# Patient Record
Sex: Female | Born: 1970 | Race: Black or African American | Hispanic: No | Marital: Single | Smoking: Current every day smoker
Health system: Southern US, Community
[De-identification: ages and names within clinical notes are randomized; demographics above are authoritative.]

## PROBLEM LIST (undated history)

## (undated) DIAGNOSIS — J45909 Unspecified asthma, uncomplicated: Secondary | ICD-10-CM

## (undated) DIAGNOSIS — M549 Dorsalgia, unspecified: Secondary | ICD-10-CM

## (undated) DIAGNOSIS — G8929 Other chronic pain: Secondary | ICD-10-CM

---

## 1998-01-11 ENCOUNTER — Inpatient Hospital Stay (HOSPITAL_COMMUNITY): Admission: AD | Admit: 1998-01-11 | Discharge: 1998-01-11 | Payer: Self-pay | Admitting: *Deleted

## 1998-09-04 ENCOUNTER — Emergency Department (HOSPITAL_COMMUNITY): Admission: EM | Admit: 1998-09-04 | Discharge: 1998-09-04 | Payer: Self-pay | Admitting: Emergency Medicine

## 1998-09-21 ENCOUNTER — Emergency Department (HOSPITAL_COMMUNITY): Admission: EM | Admit: 1998-09-21 | Discharge: 1998-09-21 | Payer: Self-pay | Admitting: Emergency Medicine

## 1999-04-23 ENCOUNTER — Emergency Department (HOSPITAL_COMMUNITY): Admission: EM | Admit: 1999-04-23 | Discharge: 1999-04-23 | Payer: Self-pay | Admitting: Emergency Medicine

## 1999-04-23 ENCOUNTER — Encounter: Payer: Self-pay | Admitting: Emergency Medicine

## 1999-04-27 ENCOUNTER — Ambulatory Visit (HOSPITAL_COMMUNITY): Admission: RE | Admit: 1999-04-27 | Discharge: 1999-04-28 | Payer: Self-pay | Admitting: General Surgery

## 1999-04-27 ENCOUNTER — Encounter (HOSPITAL_BASED_OUTPATIENT_CLINIC_OR_DEPARTMENT_OTHER): Payer: Self-pay | Admitting: General Surgery

## 1999-07-15 ENCOUNTER — Ambulatory Visit (HOSPITAL_COMMUNITY): Admission: RE | Admit: 1999-07-15 | Discharge: 1999-07-15 | Payer: Self-pay | Admitting: General Surgery

## 1999-08-19 ENCOUNTER — Emergency Department (HOSPITAL_COMMUNITY): Admission: EM | Admit: 1999-08-19 | Discharge: 1999-08-19 | Payer: Self-pay | Admitting: Emergency Medicine

## 1999-09-24 ENCOUNTER — Emergency Department (HOSPITAL_COMMUNITY): Admission: EM | Admit: 1999-09-24 | Discharge: 1999-09-24 | Payer: Self-pay | Admitting: Emergency Medicine

## 2004-04-16 ENCOUNTER — Emergency Department (HOSPITAL_COMMUNITY): Admission: EM | Admit: 2004-04-16 | Discharge: 2004-04-16 | Payer: Self-pay | Admitting: Emergency Medicine

## 2004-07-03 ENCOUNTER — Emergency Department (HOSPITAL_COMMUNITY): Admission: EM | Admit: 2004-07-03 | Discharge: 2004-07-03 | Payer: Self-pay | Admitting: Emergency Medicine

## 2004-10-07 ENCOUNTER — Emergency Department (HOSPITAL_COMMUNITY): Admission: EM | Admit: 2004-10-07 | Discharge: 2004-10-07 | Payer: Self-pay | Admitting: Emergency Medicine

## 2004-10-10 ENCOUNTER — Emergency Department (HOSPITAL_COMMUNITY): Admission: EM | Admit: 2004-10-10 | Discharge: 2004-10-10 | Payer: Self-pay | Admitting: Emergency Medicine

## 2005-07-04 ENCOUNTER — Emergency Department (HOSPITAL_COMMUNITY): Admission: EM | Admit: 2005-07-04 | Discharge: 2005-07-04 | Payer: Self-pay | Admitting: Emergency Medicine

## 2011-01-30 ENCOUNTER — Emergency Department (HOSPITAL_COMMUNITY): Payer: Self-pay

## 2011-01-30 ENCOUNTER — Emergency Department (HOSPITAL_COMMUNITY)
Admission: EM | Admit: 2011-01-30 | Discharge: 2011-01-30 | Disposition: A | Discharge status: Home / Self Care | Payer: Self-pay | Attending: Emergency Medicine | Admitting: Emergency Medicine

## 2011-01-30 DIAGNOSIS — S335XXA Sprain of ligaments of lumbar spine, initial encounter: Secondary | ICD-10-CM | POA: Insufficient documentation

## 2011-01-30 DIAGNOSIS — W108XXA Fall (on) (from) other stairs and steps, initial encounter: Secondary | ICD-10-CM | POA: Insufficient documentation

## 2011-01-30 DIAGNOSIS — M549 Dorsalgia, unspecified: Secondary | ICD-10-CM | POA: Insufficient documentation

## 2011-07-03 ENCOUNTER — Emergency Department (HOSPITAL_COMMUNITY)
Admission: EM | Admit: 2011-07-03 | Discharge: 2011-07-03 | Disposition: A | Discharge status: Home / Self Care | Payer: PRIVATE HEALTH INSURANCE | Attending: Emergency Medicine | Admitting: Emergency Medicine

## 2011-07-03 ENCOUNTER — Encounter: Payer: Self-pay | Admitting: *Deleted

## 2011-07-03 DIAGNOSIS — M549 Dorsalgia, unspecified: Secondary | ICD-10-CM | POA: Insufficient documentation

## 2011-07-03 DIAGNOSIS — M545 Low back pain, unspecified: Secondary | ICD-10-CM | POA: Insufficient documentation

## 2011-07-03 HISTORY — DX: Dorsalgia, unspecified: M54.9

## 2011-07-03 HISTORY — DX: Other chronic pain: G89.29

## 2011-07-03 MED ORDER — DIAZEPAM 5 MG PO TABS: 5.0000 mg | ORAL_TABLET | Freq: Four times a day (QID) | ORAL | Status: AC | PRN

## 2011-07-03 MED ORDER — IBUPROFEN 800 MG PO TABS: 800.0000 mg | ORAL_TABLET | Freq: Three times a day (TID) | ORAL | Status: AC | PRN

## 2011-07-03 MED ORDER — HYDROCODONE-ACETAMINOPHEN 5-325 MG PO TABS: ORAL_TABLET | ORAL | Status: AC

## 2011-07-03 NOTE — ED Notes (Signed)
Pt reports chronic back pain r/t ddd; noticed worsening back pain since Friday; denies injury;

## 2011-07-03 NOTE — ED Provider Notes (Signed)
Medical screening examination/treatment/procedure(s) were performed by non-physician practitioner and as supervising physician I was immediately available for consultation/collaboration.   Dayton Bailiff, MD 07/03/11 1248

## 2011-07-03 NOTE — ED Provider Notes (Signed)
History     CSN: [id number] Arrival date & time: 07/03/2011 11:03 AM   First MD Initiated Contact with Patient 07/03/11 1150      Chief Complaint  Patient presents with  . Back Pain    (Consider location/radiation/quality/duration/timing/severity/associated sxs/prior treatment) HPI Comments: Patient states that she suffers from chronic back pain but it's been worse since Friday. She doesn't recall any trauma or injury, however her pain has become unbearable. She rates it at an 8/10. She denies any fever, abdominal pain, urinary retention, loss of bowel or bladder control, pain radiating down her legs, numbness or weakness of her lower extremities, dizziness and lightheadedness. Patient currently has no other complaints.  Patient is a 40 y.o. female presenting with back pain. The history is provided by the patient.  Back Pain  Pertinent negatives include no chest pain, no fever, no numbness, no headaches, no abdominal pain and no weakness.    Past Medical History  Diagnosis Date  . Chronic back pain     History reviewed. No pertinent past surgical history.  No family history on file.  History  Substance Use Topics  . Smoking status: Current Everyday Smoker  . Smokeless tobacco: Not on file  . Alcohol Use: No    OB History    Grav Para Term Preterm Abortions TAB SAB Ect Mult Living                  Review of Systems  Constitutional: Positive for activity change. Negative for fever, chills, fatigue and unexpected weight change.  HENT: Negative for neck pain and neck stiffness.   Eyes: Negative for visual disturbance.  Respiratory: Negative for shortness of breath.   Cardiovascular: Negative for chest pain and leg swelling.  Gastrointestinal: Negative for nausea, abdominal pain, constipation and rectal pain.  Genitourinary: Negative for urgency and difficulty urinating.       Patient denies bowel and bladder incontinence.  Musculoskeletal: Positive for back pain.  Negative for myalgias, joint swelling, arthralgias and gait problem.  Neurological: Negative for weakness, numbness and headaches.  All other systems reviewed and are negative.    Allergies  Review of patient's allergies indicates no known allergies.  Home Medications  No current outpatient prescriptions on file.  BP 114/61  Pulse 73  Temp(Src) 98.6 F (37 C) (Oral)  Resp 18  SpO2 100%  Physical Exam  Constitutional: She is oriented to person, place, and time. She appears well-developed and well-nourished. No distress.  HENT:  Head: Normocephalic and atraumatic.  Eyes: Conjunctivae and EOM are normal. Pupils are equal, round, and reactive to light. No scleral icterus.  Neck: Normal range of motion and full passive range of motion without pain. Neck supple. No tracheal tenderness, no spinous process tenderness and no muscular tenderness present. Carotid bruit is not present. No Brudzinski's sign noted. No mass and no thyromegaly present.  Cardiovascular: Normal rate, regular rhythm and intact distal pulses.  Exam reveals no gallop and no friction rub.   No murmur heard. Pulmonary/Chest: Effort normal and breath sounds normal. No stridor. No respiratory distress. She has no wheezes. She has no rales. She exhibits no tenderness.  Abdominal: Soft. Bowel sounds are normal.  Musculoskeletal:       Cervical back: Normal. She exhibits normal range of motion, no tenderness, no bony tenderness and no pain.       Thoracic back: Normal. She exhibits no tenderness, no bony tenderness and no pain.       Lumbar back: She  exhibits tenderness and pain. She exhibits no bony tenderness, no spasm and normal pulse.       Right foot: She exhibits no swelling.       Left foot: She exhibits no swelling.       Pt has increased pain w ROM of lumbar spine. Pain w ambulation.   Neurological: She is alert and oriented to person, place, and time. She has normal strength and normal reflexes. No cranial nerve  deficit or sensory deficit.  Skin: Skin is warm and dry. No rash noted. She is not diaphoretic. No erythema. No pallor.  Psychiatric: She has a normal mood and affect.    ED Course  Procedures (including critical care time)  Labs Reviewed - No data to display No results found.   No diagnosis found.    MDM  Chronic Back Pain         Jaci Carrel, [place] 07/03/11 1220

## 2012-02-01 ENCOUNTER — Emergency Department (HOSPITAL_COMMUNITY)
Admission: EM | Admit: 2012-02-01 | Discharge: 2012-02-01 | Disposition: A | Discharge status: Home / Self Care | Payer: PRIVATE HEALTH INSURANCE | Attending: Emergency Medicine | Admitting: Emergency Medicine

## 2012-02-01 ENCOUNTER — Encounter (HOSPITAL_COMMUNITY): Payer: Self-pay | Admitting: *Deleted

## 2012-02-01 DIAGNOSIS — M545 Low back pain, unspecified: Secondary | ICD-10-CM | POA: Insufficient documentation

## 2012-02-01 DIAGNOSIS — G8929 Other chronic pain: Secondary | ICD-10-CM | POA: Insufficient documentation

## 2012-02-01 HISTORY — DX: Unspecified asthma, uncomplicated: J45.909

## 2012-02-01 MED ORDER — HYDROCODONE-ACETAMINOPHEN 5-325 MG PO TABS: 1.0000 | ORAL_TABLET | Freq: Four times a day (QID) | ORAL | Status: AC | PRN

## 2012-02-01 MED ORDER — CYCLOBENZAPRINE HCL 10 MG PO TABS: 10.0000 mg | ORAL_TABLET | Freq: Two times a day (BID) | ORAL | Status: AC | PRN

## 2012-02-01 NOTE — Discharge Instructions (Signed)
Follow up with your orthopedist as previously scheduled.  Do not take more than one product that contains acetaminophen as it can leads to overdose.    Chronic Back Pain When back pain lasts longer than 3 months, it is called chronic back pain.This pain can be frustrating, but the cause of the pain is rarely dangerous.People with chronic back pain often go through certain periods that are more intense (flare-ups). CAUSES Chronic back pain can be caused by wear and tear (degeneration) on different structures in your back. These structures may include bones, ligaments, or discs. This degeneration may result in more pressure being placed on the nerves that travel to your legs and feet. This can lead to pain traveling from the low back down the back of the legs. When pain lasts longer than 3 months, it is not unusual for people to experience anxiety or depression. Anxiety and depression can also contribute to low back pain. TREATMENT  Establish a regular exercise plan. This is critical to improving your functional level.   Have a self-management plan for when you flare-up. Flare-ups rarely require a medical visit. Regular exercise will help reduce the intensity and frequency of your flare-ups.   Manage how you feel about your back pain and the rest of your life. Anxiety, depression, and feeling that you cannot alter your back pain have been shown to make back pain more intense and debilitating.   Medicines should never be your only treatment. They should be used along with other treatments to help you return to a more active lifestyle.   Procedures such as injections or surgery may be helpful but are rarely necessary. You may be able to get the same results with physical therapy or chiropractic care.  HOME CARE INSTRUCTIONS  Avoid bending, heavy lifting, prolonged sitting, and activities which make the problem worse.   Continue normal activity as much as possible.   Take brief periods of rest  throughout the day to reduce your pain during flare-ups.   Follow your back exercise rehabilitation program. This can help reduce symptoms and prevent more pain.   Only take over-the-counter or prescription medicines as directed by your caregiver. Muscle relaxants are sometimes prescribed. Narcotic pain medicine is discouraged for long-term pain, since addiction is a possible outcome.   If you smoke, quit.   Eat healthy foods and maintain a recommended body weight.  SEEK IMMEDIATE MEDICAL CARE IF:   You have weakness or numbness in one of your legs or feet.   You have trouble controlling your bladder or bowels.   You develop nausea, vomiting, abdominal pain, shortness of breath, or fainting.  Document Released: 09/08/2004 Document Revised: 07/21/2011 Document Reviewed: 07/16/2011 [facility] Patient Information 2012 [place], [place].

## 2012-02-01 NOTE — ED Provider Notes (Signed)
History     CSN: [id number]  Arrival date & time 02/01/12  9562   First MD Initiated Contact with Patient 02/01/12 0744      Chief Complaint  Patient presents with  . Back Pain    (Consider location/radiation/quality/duration/timing/severity/associated sxs/prior treatment) HPI  41 year old female with history of chronic back pain presents complaining of her usual back pain. She is having pain to her low back the past 2 days. Onset was gradual, waxing and waning, described as sharp and burning sensation that is nonradiating. Pain worsening when she arches her back or certain position, including increased activities. Pain improves with aspirin and Tylenol. She relates her pain to the the increased activity level in the past week. She denies fever, chills, chest pain, shortness of breath, abdominal pain. She denies rash, recent trauma, or urinary symptoms. She is scheduled to followup with the orthopedic on July 24th but sts she needs better pain management until then.  Sts she has tried OTC meds as much as possible to prevent coming to ER.  Her last visit was 4 months ago for same.   Past Medical History  Diagnosis Date  . Chronic back pain   . Asthma     History reviewed. No pertinent past surgical history.  No family history on file.  History  Substance Use Topics  . Smoking status: Current Everyday Smoker -- 0.5 packs/day    Types: Cigarettes  . Smokeless tobacco: Not on file  . Alcohol Use: No    OB History    Grav Para Term Preterm Abortions TAB SAB Ect Mult Living                  Review of Systems  Constitutional: Negative for fever and chills.  Respiratory: Negative for shortness of breath.   Cardiovascular: Negative for leg swelling.  Gastrointestinal: Negative for abdominal pain and constipation.  Genitourinary: Negative for dysuria.  Musculoskeletal: Positive for back pain. Negative for myalgias and gait problem.  Skin: Negative for rash and wound.    Neurological: Negative for weakness, numbness and headaches.  All other systems reviewed and are negative.    Allergies  Review of patient's allergies indicates no known allergies.  Home Medications  No current outpatient prescriptions on file.  BP 118/71  Pulse 75  Temp 98.1 F (36.7 C) (Oral)  Resp 16  Ht 5\' 2"  (1.575 m)  Wt 210 lb (95.255 kg)  BMI 38.41 kg/m2  SpO2 99%  LMP 02/01/2012  Physical Exam  Nursing note and vitals reviewed. Constitutional: She appears well-developed and well-nourished. No distress.  HENT:  Head: Atraumatic.  Eyes: Conjunctivae are normal.  Neck: Neck supple.  Cardiovascular: Normal rate and regular rhythm.   Pulmonary/Chest: Effort normal and breath sounds normal. No respiratory distress.  Abdominal: Soft. Bowel sounds are normal. She exhibits no distension. There is no tenderness.  Musculoskeletal: Normal range of motion. She exhibits no edema.       Right hip: Normal.       Left hip: Normal.       Cervical back: Normal.       Thoracic back: Normal.       Lumbar back: She exhibits tenderness, bony tenderness and pain. She exhibits normal range of motion, no swelling, no edema, no deformity and no laceration.  Neurological: She is alert.  Reflex Scores:      Patellar reflexes are 2+ on the right side and 2+ on the left side. Skin: Skin is warm. No rash  noted.    ED Course  Procedures (including critical care time)  Labs Reviewed - No data to display No results found.   No diagnosis found.    MDM  Acute on chronic low back pain. Patient is afebrile, with stable normal vital signs. No urinary symptoms, no red flags finding. Patient does have followup appointment with orthopedic next month period pain to get a short course of pain meds and muscle relaxant. Patient voiced understanding and agrees with plan.  Pt appears nontoxic.        Fayrene Helper, PA-C 02/01/12 0815  Fayrene Helper, PA-C 02/01/12 [phone]

## 2012-02-01 NOTE — ED Provider Notes (Signed)
Medical screening examination/treatment/procedure(s) were performed by non-physician practitioner and as supervising physician I was immediately available for consultation/collaboration.   Lyanne Co, MD 02/01/12 [phone]

## 2012-02-01 NOTE — ED Notes (Signed)
Pt reports chronic back pain that has progressively gotten worse. Reports she was seen here in past, was referred to specialist but cannot schedule immediate apt. Reports taking tylenol without relief.

## 2012-02-01 NOTE — ED Notes (Signed)
Pt states that she has a ruptured disk in her lower back that's causing her pain that has been getting worse x3 days, and hx of degenerative joint disease.

## 2013-03-02 ENCOUNTER — Emergency Department (HOSPITAL_COMMUNITY)
Admission: EM | Admit: 2013-03-02 | Discharge: 2013-03-02 | Disposition: A | Discharge status: Home / Self Care | Payer: PRIVATE HEALTH INSURANCE | Attending: Emergency Medicine | Admitting: Emergency Medicine

## 2013-03-02 ENCOUNTER — Encounter (HOSPITAL_COMMUNITY): Payer: Self-pay

## 2013-03-02 DIAGNOSIS — Z79899 Other long term (current) drug therapy: Secondary | ICD-10-CM | POA: Insufficient documentation

## 2013-03-02 DIAGNOSIS — G8929 Other chronic pain: Secondary | ICD-10-CM | POA: Insufficient documentation

## 2013-03-02 DIAGNOSIS — F17200 Nicotine dependence, unspecified, uncomplicated: Secondary | ICD-10-CM | POA: Insufficient documentation

## 2013-03-02 DIAGNOSIS — J45909 Unspecified asthma, uncomplicated: Secondary | ICD-10-CM | POA: Insufficient documentation

## 2013-03-02 DIAGNOSIS — R209 Unspecified disturbances of skin sensation: Secondary | ICD-10-CM | POA: Insufficient documentation

## 2013-03-02 DIAGNOSIS — M549 Dorsalgia, unspecified: Secondary | ICD-10-CM | POA: Insufficient documentation

## 2013-03-02 MED ORDER — HYDROCODONE-ACETAMINOPHEN 5-325 MG PO TABS
2.0000 | ORAL_TABLET | Freq: Once | ORAL | Status: AC
  Administered 2013-03-02: 2 via ORAL
  Filled 2013-03-02: qty 2

## 2013-03-02 MED ORDER — CYCLOBENZAPRINE HCL 10 MG PO TABS: 10.0000 mg | ORAL_TABLET | Freq: Two times a day (BID) | ORAL | Status: DC | PRN

## 2013-03-02 MED ORDER — HYDROCODONE-ACETAMINOPHEN 5-325 MG PO TABS: 2.0000 | ORAL_TABLET | Freq: Four times a day (QID) | ORAL | Status: DC | PRN

## 2013-03-02 NOTE — ED Provider Notes (Signed)
Medical screening examination/treatment/procedure(s) were performed by non-physician practitioner and as supervising physician I was immediately available for consultation/collaboration.  Betzalel Umbarger, MD 03/02/13 2334 

## 2013-03-02 NOTE — ED Provider Notes (Signed)
History    This chart was scribed for non-physician practitioner Roxy Horseman, PA working with Doug Sou, MD by Quintella Reichert, ED Scribe. This patient was seen in room [facility] and the patient's care was started at 5:03 PM.   CSN: [id number]  Arrival date & time 03/02/13  1604    Chief Complaint  Patient presents with  . Back Pain    The history is provided by the patient. No language interpreter was used.    HPI Comments: Shelly Villanueva is a 42 y.o. female with h/o chronic back pain who presents to the Emergency Department with a chief complaint of 3 days of constant, gradual-onset, moderate back pain.  Symptoms are similar to pt's chronic back pain and pt denies any recent trauma that may have exacerbated pain.  Pain is exacerbated by certain movements.  She also states that her left leg intermittently goes numb.  She denies weakness in arms or legs, urinary or bowel incontinence, fever, or any other associated symptoms.  Pt has attempted to treat pain with Tylenol and Aleve, without relief.  She notes she also was prescribed hydrocodone by her PCP, which she states has provided relief historically but she does not like to take them because they make her feel "funny."  In addition she notes that she has been prescribed flexeril in the past but recently ran out of this.  Pt states that she has h/o "messed up disc in my back."    Pt is currently relocating to [place] and notes she is in the process of finding a new PCP.     Past Medical History  Diagnosis Date  . Chronic back pain   . Asthma     History reviewed. No pertinent past surgical history.   History reviewed. No pertinent family history.   History  Substance Use Topics  . Smoking status: Current Every Day Smoker -- 0.50 packs/day    Types: Cigarettes  . Smokeless tobacco: Not on file  . Alcohol Use: No    OB History   Grav Para Term Preterm Abortions TAB SAB Ect Mult Living                   Review of Systems A complete 10 system review of systems was obtained and all systems are negative except as noted in the HPI and PMH.    Allergies  Review of patient's allergies indicates no known allergies.  Home Medications   Current Outpatient Rx  Name  Route  Sig  Dispense  Refill  . cyclobenzaprine (FLEXERIL) 10 MG tablet   Oral   Take 10 mg by mouth 3 (three) times daily as needed for muscle spasms.         Marland Kitchen HYDROcodone-acetaminophen (NORCO) 7.5-325 MG per tablet   Oral   Take 1 tablet by mouth every 6 (six) hours as needed for pain.         . ranitidine (ZANTAC) 150 MG tablet   Oral   Take 150 mg by mouth 2 (two) times daily.          BP 107/63  Pulse 66  Temp(Src) 99.6 F (37.6 C) (Oral)  Resp 18  SpO2 98%  LMP 02/15/2013  Physical Exam  Nursing note and vitals reviewed. Constitutional: She is oriented to person, place, and time. She appears well-developed and well-nourished. No distress.  HENT:  Head: Normocephalic and atraumatic.  Eyes: EOM are normal.  Neck: Neck supple. No tracheal deviation present.  Cardiovascular:  Normal rate.   Pulmonary/Chest: Effort normal. No respiratory distress.  Musculoskeletal: Normal range of motion. She exhibits tenderness.  No tenderness, step-offs or deformities of the spine. Lumbar paraspinal muscles are mildly tender to palpation.  Neurological: She is alert and oriented to person, place, and time. She has normal strength. No sensory deficit.  Sensation and strength intact.  Skin: Skin is warm and dry.  Psychiatric: She has a normal mood and affect. Her behavior is normal.    ED Course  Procedures (including critical care time)  DIAGNOSTIC STUDIES: Oxygen Saturation is 98% on room air, normal by my interpretation.    COORDINATION OF CARE: 5:07 PM-Discussed treatment plan which includes pain medication and referral to PCP with pt at bedside and pt agreed to plan.     Labs Reviewed - No data to  display  No results found.  1. Back pain      MDM  Patient with back pain.  No neurological deficits and normal neuro exam.  Patient can walk but states is painful.  No loss of bowel or bladder control.  No concern for cauda equina.  No fever, night sweats, weight loss, h/o cancer, IVDU.  RICE protocol and pain medicine indicated and discussed with patient.       I personally performed the services described in this documentation, which was scribed in my presence. The recorded information has been reviewed and is accurate.     Roxy Horseman, PA-C 03/02/13 2204

## 2013-03-02 NOTE — ED Notes (Signed)
Pt has a ride home.  

## 2013-03-02 NOTE — ED Notes (Signed)
She tells me she has had low back pain "for a while now".   She states she has a known "disc problem", and "it kicks up every once-in-a-while".  She denies trauma/fever, nor any sign of recent illness.  She is in no distress.

## 2013-10-14 ENCOUNTER — Emergency Department (HOSPITAL_COMMUNITY)
Admission: EM | Admit: 2013-10-14 | Discharge: 2013-10-14 | Disposition: A | Discharge status: Home / Self Care | Payer: No Typology Code available for payment source | Attending: Emergency Medicine | Admitting: Emergency Medicine

## 2013-10-14 ENCOUNTER — Encounter (HOSPITAL_COMMUNITY): Payer: Self-pay | Admitting: Emergency Medicine

## 2013-10-14 DIAGNOSIS — F17200 Nicotine dependence, unspecified, uncomplicated: Secondary | ICD-10-CM | POA: Insufficient documentation

## 2013-10-14 DIAGNOSIS — M549 Dorsalgia, unspecified: Secondary | ICD-10-CM

## 2013-10-14 DIAGNOSIS — M545 Low back pain, unspecified: Secondary | ICD-10-CM | POA: Insufficient documentation

## 2013-10-14 DIAGNOSIS — J45909 Unspecified asthma, uncomplicated: Secondary | ICD-10-CM | POA: Insufficient documentation

## 2013-10-14 DIAGNOSIS — G8929 Other chronic pain: Secondary | ICD-10-CM | POA: Insufficient documentation

## 2013-10-14 MED ORDER — CYCLOBENZAPRINE HCL 10 MG PO TABS: 10.0000 mg | ORAL_TABLET | Freq: Two times a day (BID) | ORAL | Status: DC | PRN

## 2013-10-14 MED ORDER — HYDROCODONE-ACETAMINOPHEN 5-325 MG PO TABS: 1.0000 | ORAL_TABLET | ORAL | Status: DC | PRN

## 2013-10-14 NOTE — Discharge Instructions (Signed)
Back Injury Prevention °Back injuries can be extremely painful and difficult to heal. After having one back injury, you are much more likely to experience another later on. It is important to learn how to avoid injuring or re-injuring your back. The following tips can help you to prevent a back injury. °PHYSICAL FITNESS °· Exercise regularly and try to develop good tone in your abdominal muscles. Your abdominal muscles provide a lot of the support needed by your back. °· Do aerobic exercises (walking, jogging, biking, swimming) regularly. °· Do exercises that increase balance and strength (tai chi, yoga) regularly. This can decrease your risk of falling and injuring your back. °· Stretch before and after exercising. °· Maintain a healthy weight. The more you weigh, the more stress is placed on your back. For every pound of weight, 10 times that amount of pressure is placed on the back. °DIET °· Talk to your caregiver about how much calcium and vitamin D you need per day. These nutrients help to prevent weakening of the bones (osteoporosis). Osteoporosis can cause broken (fractured) bones that lead to back pain. °· Include good sources of calcium in your diet, such as dairy products, green, leafy vegetables, and products with calcium added (fortified). °· Include good sources of vitamin D in your diet, such as milk and foods that are fortified with vitamin D. °· Consider taking a nutritional supplement or a multivitamin if needed. °· Stop smoking if you smoke. °POSTURE °· Sit and stand up straight. Avoid leaning forward when you sit or hunching over when you stand. °· Choose chairs with good low back (lumbar) support. °· If you work at a desk, sit close to your work so you do not need to lean over. Keep your chin tucked in. Keep your neck drawn back and elbows bent at a right angle. Your arms should look like the letter "L." °· Sit high and close to the steering wheel when you drive. Add a lumbar support to your car  seat if needed. °· Avoid sitting or standing in one position for too long. Take breaks to get up, stretch, and walk around at least once every hour. Take breaks if you are driving for long periods of time. °· Sleep on your side with your knees slightly bent, or sleep on your back with a pillow under your knees. Do not sleep on your stomach. °LIFTING, TWISTING, AND REACHING °· Avoid heavy lifting, especially repetitive lifting. If you must do heavy lifting: °· Stretch before lifting. °· Work slowly. °· Rest between lifts. °· Use carts and dollies to move objects when possible. °· Make several small trips instead of carrying 1 heavy load. °· Ask for help when you need it. °· Ask for help when moving big, awkward objects. °· Follow these steps when lifting: °· Stand with your feet shoulder-width apart. °· Get as close to the object as you can. Do not try to pick up heavy objects that are far from your body. °· Use handles or lifting straps if they are available. °· Bend at your knees. Squat down, but keep your heels off the floor. °· Keep your shoulders pulled back, your chin tucked in, and your back straight. °· Lift the object slowly, tightening the muscles in your legs, abdomen, and buttocks. Keep the object as close to the center of your body as possible. °· When you put a load down, use these same guidelines in reverse. °· Do not: °· Lift the object above your waist. °·   Twist at the waist while lifting or carrying a load. Move your feet if you need to turn, not your waist.  Bend over without bending at your knees.  Avoid reaching over your head, across a table, or for an object on a high surface. OTHER TIPS  Avoid wet floors and keep sidewalks clear of ice to prevent falls.  Do not sleep on a mattress that is too soft or too hard.  Keep items that are used frequently within easy reach.  Put heavier objects on shelves at waist level and lighter objects on lower or higher shelves.  Find ways to  decrease your stress, such as exercise, massage, or relaxation techniques. Stress can build up in your muscles. Tense muscles are more vulnerable to injury.  Seek treatment for depression or anxiety if needed. These conditions can increase your risk of developing back pain. SEEK MEDICAL CARE IF:  You injure your back.  You have questions about diet, exercise, or other ways to prevent back injuries. MAKE SURE YOU:  Understand these instructions.  Will watch your condition.  Will get help right away if you are not doing well or get worse. Document Released: 09/08/2004 Document Revised: 10/24/2011 Document Reviewed: 09/12/2011 [facility] Patient Information 2014 [place], [place].  Back Pain, Adult Low back pain is very common. About 1 in 5 people have back pain.The cause of low back pain is rarely dangerous. The pain often gets better over time.About half of people with a sudden onset of back pain feel better in just 2 weeks. About 8 in 10 people feel better by 6 weeks.  CAUSES Some common causes of back pain include:  Strain of the muscles or ligaments supporting the spine.  Wear and tear (degeneration) of the spinal discs.  Arthritis.  Direct injury to the back. DIAGNOSIS Most of the time, the direct cause of low back pain is not known.However, back pain can be treated effectively even when the exact cause of the pain is unknown.Answering your caregiver's questions about your overall health and symptoms is one of the most accurate ways to make sure the cause of your pain is not dangerous. If your caregiver needs more information, he or she may order lab work or imaging tests (X-rays or MRIs).However, even if imaging tests show changes in your back, this usually does not require surgery. HOME CARE INSTRUCTIONS For many people, back pain returns.Since low back pain is rarely dangerous, it is often a condition that people can learn to [facility] their own.   Remain active. It is  stressful on the back to sit or stand in one place. Do not sit, drive, or stand in one place for more than 30 minutes at a time. Take short walks on level surfaces as soon as pain allows.Try to increase the length of time you walk each day.  Do not stay in bed.Resting more than 1 or 2 days can delay your recovery.  Do not avoid exercise or work.Your body is made to move.It is not dangerous to be active, even though your back may hurt.Your back will likely heal faster if you return to being active before your pain is gone.  Pay attention to your body when you bend and lift. Many people have less discomfortwhen lifting if they bend their knees, keep the load close to their bodies,and avoid twisting. Often, the most comfortable positions are those that put less stress on your recovering back.  Find a comfortable position to sleep. Use a firm mattress and  lie on your side with your knees slightly bent. If you lie on your back, put a pillow under your knees.  Only take over-the-counter or prescription medicines as directed by your caregiver. Over-the-counter medicines to reduce pain and inflammation are often the most helpful.Your caregiver may prescribe muscle relaxant drugs.These medicines help dull your pain so you can more quickly return to your normal activities and healthy exercise.  Put ice on the injured area.  Put ice in a plastic bag.  Place a towel between your skin and the bag.  Leave the ice on for 15-20 minutes, 03-04 times a day for the first 2 to 3 days. After that, ice and heat may be alternated to reduce pain and spasms.  Ask your caregiver about trying back exercises and gentle massage. This may be of some benefit.  Avoid feeling anxious or stressed.Stress increases muscle tension and can worsen back pain.It is important to recognize when you are anxious or stressed and learn ways to manage it.Exercise is a great option. SEEK MEDICAL CARE IF:  You have pain that is  not relieved with rest or medicine.  You have pain that does not improve in 1 week.  You have new symptoms.  You are generally not feeling well. SEEK IMMEDIATE MEDICAL CARE IF:   You have pain that radiates from your back into your legs.  You develop new bowel or bladder control problems.  You have unusual weakness or numbness in your arms or legs.  You develop nausea or vomiting.  You develop abdominal pain.  You feel faint. Document Released: 08/01/2005 Document Revised: 01/31/2012 Document Reviewed: 12/20/2010 [facility] Patient Information 2014 [place], [place].

## 2013-10-14 NOTE — ED Provider Notes (Signed)
Medical screening examination/treatment/procedure(s) were performed by non-physician practitioner and as supervising physician I was immediately available for consultation/collaboration.   EKG Interpretation None        Gwyneth SproutWhitney Jamion Carter, MD 10/14/13 1517

## 2013-10-14 NOTE — ED Provider Notes (Signed)
CSN: [id number]     Arrival date & time 10/14/13  1302 History  This chart was scribed for non-physician practitioner Shelly Peliffany Remmy Riffe, PA-C working with Shelly SproutWhitney Plunkett, MD by Shelly Villanueva, ED Scribe. This patient was seen in room WTR8/WTR8 and the patient's care was started at 2:42 PM.    Chief Complaint  Patient presents with  . Back Pain   The history is provided by the patient. No language interpreter was used.   HPI Comments: Shelly Villanueva is a 43 y.o. Female with a history of chronic back pain secondary to disc degeneration who presents to the Emergency Department complaining of a constant pain to the lower back that she states is chronic in nature, but has been progressively worsening over the past 3 days. Patient states that the pain is exacerbated with flexion. She denies any potential injury or trauma to the area, but states that she works at [employer] and does more strenuous types of activities. She reports taking "Back and Body" at home, which she states caused some nausea, so she stopped taking it. Patient also reports taking Tylenol at home without significant relief. She states that she has taken hydrocodone (7.5 mg) in the past, which was effective at relieving her chronic pain symptoms. She states that she still has a prescription for Flexeril at home, but that she does not take them as much. Patient is requesting an orthopedic referral. She denies fever, bowel or bladder incontinence. Patient also has a history of asthma.   Past Medical History  Diagnosis Date  . Chronic back pain   . Asthma    History reviewed. No pertinent past surgical history. No family history on file. History  Substance Use Topics  . Smoking status: Current Every Day Smoker -- 0.50 packs/day    Types: Cigarettes  . Smokeless tobacco: Not on file  . Alcohol Use: No   OB History   Grav Para Term Preterm Abortions TAB SAB Ect Mult Living                 Review of Systems  Constitutional: Negative  for fever.  Genitourinary:       No incontinence  Musculoskeletal: Positive for back pain.  All other systems reviewed and are negative.    Allergies  Review of patient's allergies indicates no known allergies.  Home Medications   Current Outpatient Rx  Name  Route  Sig  Dispense  Refill  . OVER THE COUNTER MEDICATION   Oral   Take 2 tablets by mouth 4 (four) times daily as needed (back and body pain.).         Marland Kitchen. cyclobenzaprine (FLEXERIL) 10 MG tablet   Oral   Take 1 tablet (10 mg total) by mouth 2 (two) times daily as needed for muscle spasms.   20 tablet   0   . HYDROcodone-acetaminophen (NORCO/VICODIN) 5-325 MG per tablet   Oral   Take 1-2 tablets by mouth every 4 (four) hours as needed.   15 tablet   0    Triage Vitals: BP 102/44  Pulse 75  Temp(Src) 98.1 F (36.7 C) (Oral)  Resp 16  SpO2 99%  LMP 10/14/2013  Physical Exam  Nursing note and vitals reviewed. Constitutional: She is oriented to person, place, and time. She appears well-developed and well-nourished. No distress.  HENT:  Head: Normocephalic and atraumatic.  Eyes: Conjunctivae are normal.  Neck: Normal range of motion. Neck supple.  Pulmonary/Chest: Effort normal. No respiratory distress.  Abdominal: She  exhibits no distension.  Musculoskeletal: Normal range of motion.   Equal strength to bilateral lower extremities. Neurosensory function adequate to both legs. Skin color is normal. Skin is warm and moist. I see no step off deformity, no bony tenderness. Pt is able to ambulate without limp. Pain is relieved when sitting in certain positions. ROM is decreased due to pain. No crepitus, laceration, effusion, swelling.  Pulses are normal   Neurological: She is alert and oriented to person, place, and time.  Skin: Skin is warm and dry.  Psychiatric: She has a normal mood and affect. Her behavior is normal.    ED Course  Procedures (including critical care time)  DIAGNOSTIC STUDIES: Oxygen  Saturation is 99% on room air, normal by my interpretation.    COORDINATION OF CARE: 2:45 PM- Will discharge patient with Vicodin to manage symptoms. Advised patient to follow up with the referred orthopedist. Return precautions given. Discussed treatment plan with patient at bedside and patient verbalized agreement.     Labs Review Labs Reviewed - No data to display Imaging Review No results found.   EKG Interpretation None      MDM   Final diagnoses:  Back pain    42 y.o.Shelly Villanueva's  with back pain. No neurological deficits and normal neuro exam. Patient can walk but states is painful. No loss of bowel or bladder control. No concern for cauda equina. No fever, night sweats, weight loss, h/o cancer, IVDU. RICE protocol and pain medicine indicated and discussed with patient.   Patient Plan 1. Medications: narcotic pain medication, muscle relaxer and usual home medications  2. Treatment: rest, drink plenty of fluids, gentle stretching as discussed, alternate ice and heat  3. Follow Up: Please followup with your primary doctor for discussion of your diagnoses and further evaluation after today's visit; if you do not have a primary care doctor use the resource guide provided to find one   Vital signs are stable at discharge. Filed Vitals:   10/14/13 1310  BP: 102/44  Pulse: 75  Temp: 98.1 F (36.7 C)  Resp: 16    Patient/guardian has voiced understanding and agreed to follow-up with the PCP or specialist.      I personally performed the services described in this documentation, which was scribed in my presence. The recorded information has been reviewed and is accurate.     Shelly Matas, PA-C 10/14/13 1452

## 2013-10-14 NOTE — ED Notes (Signed)
Pt reports chronic lower back pain that is getting worse, sts she needs referral to orthopedic doctor.

## 2014-01-10 ENCOUNTER — Emergency Department (HOSPITAL_COMMUNITY)
Admission: EM | Admit: 2014-01-10 | Discharge: 2014-01-10 | Disposition: A | Discharge status: Home / Self Care | Payer: No Typology Code available for payment source | Attending: Emergency Medicine | Admitting: Emergency Medicine

## 2014-01-10 ENCOUNTER — Encounter (HOSPITAL_COMMUNITY): Payer: Self-pay | Admitting: Emergency Medicine

## 2014-01-10 DIAGNOSIS — F17200 Nicotine dependence, unspecified, uncomplicated: Secondary | ICD-10-CM | POA: Insufficient documentation

## 2014-01-10 DIAGNOSIS — J45909 Unspecified asthma, uncomplicated: Secondary | ICD-10-CM | POA: Insufficient documentation

## 2014-01-10 DIAGNOSIS — M549 Dorsalgia, unspecified: Secondary | ICD-10-CM

## 2014-01-10 DIAGNOSIS — M545 Low back pain, unspecified: Secondary | ICD-10-CM | POA: Insufficient documentation

## 2014-01-10 DIAGNOSIS — G8929 Other chronic pain: Secondary | ICD-10-CM | POA: Insufficient documentation

## 2014-01-10 NOTE — ED Provider Notes (Signed)
CSN: [id number]     Arrival date & time 01/10/14  1233 History  This chart was scribed for non-physician practitioner Madelyn Flavors, PA-C working with Toy Baker, MD by Dorothey Baseman, ED Scribe. This patient was seen in room WTR7/WTR7 and the patient's care was started at 1:27 PM.    Chief Complaint  Patient presents with  . Back Pain   The history is provided by the patient. No language interpreter was used.   HPI Comments: Shelly Villanueva is a 43 y.o. Female with a history of chronic back pain who presents to the Emergency Department complaining of a constant, sharp/burning pain to the lower back, worse on the right, that she states is chronic in nature. She states that her pain has been progressively worsening over the past 2 days, rated 8/10 currently. Patient denies any potential injury or trauma to the area. She states that the pain is exacerbated with movement and heavy lifting (patient works as a Child psychotherapist), but denies exacerbation with walking. She reports taking Flexeril and Tylenol at home without significant relief. Patient was seen here about 3 months ago on 10/14/2013 for similar complaints and was discharged with Flexeril and referred to a spine specialist. She states that she followed up and had an MRI done about 2.5 weeks ago, but lost the paper and is requesting a referral to a different specialist as they were rude to her. She states that she has taken steroids in the past, but that it was not effective at alleviating her pain. She reports that she has also taken Celebrex and Mobic, both of which caused her to become nauseous. She states that she has also tried physical therapy without improvement. Patient states that her chronic pain has been treated with 7.5 mg hydrocodone in the past. She denies bowel or bladder incontinence, groin numbness. Patient also has a history of asthma. Patient is a current every day smoker, 0.5 PPD.  Past Medical History  Diagnosis Date  . Chronic  back pain   . Asthma    History reviewed. No pertinent past surgical history. History reviewed. No pertinent family history. History  Substance Use Topics  . Smoking status: Current Every Day Smoker -- 0.50 packs/day    Types: Cigarettes  . Smokeless tobacco: Not on file  . Alcohol Use: No   OB History   Grav Para Term Preterm Abortions TAB SAB Ect Mult Living                 Review of Systems  Musculoskeletal: Positive for back pain.  Neurological: Negative for numbness.       No incontinence  All other systems reviewed and are negative.   Allergies  Review of patient's allergies indicates no known allergies.  Home Medications   Prior to Admission medications   Medication Sig Start Date End Date Taking? Authorizing Provider  acetaminophen (TYLENOL) 500 MG tablet Take 1,000 mg by mouth every 6 (six) hours as needed for moderate pain.   Yes Historical Provider, MD  albuterol (PROVENTIL HFA;VENTOLIN HFA) 108 (90 BASE) MCG/ACT inhaler Inhale 2 puffs into the lungs every 6 (six) hours as needed for wheezing or shortness of breath.   Yes Historical Provider, MD  cyclobenzaprine (FLEXERIL) 10 MG tablet Take 1 tablet (10 mg total) by mouth 2 (two) times daily as needed for muscle spasms. 10/14/13  Yes Dorthula Matas, PA-C   Triage Vitals: BP 116/65  Pulse 75  Temp(Src) 98.1 F (36.7 C) (Oral)  Resp  18  SpO2 100%  LMP 12/25/2013  Physical Exam  Nursing note and vitals reviewed. Constitutional: She is oriented to person, place, and time. She appears well-developed and well-nourished. No distress.  HENT:  Head: Normocephalic and atraumatic.  Eyes: Conjunctivae are normal.  Neck: Normal range of motion. Neck supple.  Cardiovascular: Normal rate, regular rhythm, normal heart sounds and intact distal pulses.   Pulmonary/Chest: Effort normal and breath sounds normal. No respiratory distress.  Abdominal: She exhibits no distension.  Musculoskeletal: Normal range of motion.   Patient ambulates without an antalgic gait.  Mild tenderness to palpation over the right lumbar paraspinal muscles.  Full range of motion of the back with flexion, extension, and lateral rotation. Mild discomfort with flexion of the back.   Neurological: She is alert and oriented to person, place, and time. She has normal reflexes. She displays normal reflexes. No sensory deficit.  Reflex Scores:      Brachioradialis reflexes are 2+ on the right side and 2+ on the left side.      Patellar reflexes are 2+ on the right side and 2+ on the left side.      Achilles reflexes are 2+ on the right side and 2+ on the left side. Normal strength against resistance of bilateral upper and lower extremities. Distal sensation intact throughout.   Skin: Skin is warm and dry.  Psychiatric: She has a normal mood and affect. Her behavior is normal. Judgment and thought content normal.    ED Course  Procedures (including critical care time)  DIAGNOSTIC STUDIES: Oxygen Saturation is 100% on room air, normal by my interpretation.    COORDINATION OF CARE: 1:45 PM- Offered patient Mobic and Celebrex, which she declined and stated that she did not like its side effects. Discussed that narcotic pain medication is not appropriate to treat chronic back pain. Will refer patient to the same orthopedist. Discussed treatment plan with patient at bedside and patient verbalized agreement.     Labs Review Labs Reviewed - No data to display  Imaging Review No results found.   EKG Interpretation None      MDM   Final diagnoses:  Chronic back pain   Patient was seen for back pain.  She has already had an MRI obtained and her goals of care was to receive referral to a different spine specialist.  Exam is benign with no signs of cauda equina or focal neuro defecits at this time.  Will discharge home without pain medication as the patient did drive herself.  I will give [facility] Dr. Darrelyn HillockGioffre.  The patient understands this plan and agrees with it.    I personally performed the services described in this documentation, which was scribed in my presence. The recorded information has been reviewed and is accurate.       Clydie Braunourtney A Forucci, PA-C 01/10/14 1402

## 2014-01-10 NOTE — ED Notes (Signed)
Pt c/o intermittent chronic back pain x 1.5 years.  Pain score 8/10.  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  Pt has been seen at [facility] for same and referred to specialist, but has been unable to get an appointment.

## 2014-01-10 NOTE — Discharge Instructions (Signed)
Chronic Back Pain   When back pain lasts longer than 3 months, it is called chronic back pain.People with chronic back pain often go through certain periods that are more intense (flare-ups).   CAUSES  Chronic back pain can be caused by wear and tear (degeneration) on different structures in your back. These structures include:   The bones of your spine (vertebrae) and the joints surrounding your spinal cord and nerve roots (facets).   The strong, fibrous tissues that connect your vertebrae (ligaments).  Degeneration of these structures may result in pressure on your nerves. This can lead to constant pain.  HOME CARE INSTRUCTIONS   Avoid bending, heavy lifting, prolonged sitting, and activities which make the problem worse.   Take brief periods of rest throughout the day to reduce your pain. Lying down or standing usually is better than sitting while you are resting.   Take over-the-counter or prescription medicines only as directed by your caregiver.  SEEK IMMEDIATE MEDICAL CARE IF:    You have weakness or numbness in one of your legs or feet.   You have trouble controlling your bladder or bowels.   You have nausea, vomiting, abdominal pain, shortness of breath, or fainting.  Document Released: 09/08/2004 Document Revised: 10/24/2011 Document Reviewed: 07/16/2011  [employer] Patient Information 2014 [employer].

## 2014-01-11 NOTE — ED Provider Notes (Signed)
Medical screening examination/treatment/procedure(s) were performed by non-physician practitioner and as supervising physician I was immediately available for consultation/collaboration.   Yomayra Tate T Cira Deyoe, MD 01/11/14 1623 

## 2014-08-13 ENCOUNTER — Encounter (HOSPITAL_COMMUNITY): Payer: Self-pay | Admitting: Emergency Medicine

## 2014-08-13 ENCOUNTER — Emergency Department (HOSPITAL_COMMUNITY)
Admission: EM | Admit: 2014-08-13 | Discharge: 2014-08-13 | Disposition: A | Discharge status: Home / Self Care | Payer: PRIVATE HEALTH INSURANCE | Attending: Emergency Medicine | Admitting: Emergency Medicine

## 2014-08-13 DIAGNOSIS — L02411 Cutaneous abscess of right axilla: Secondary | ICD-10-CM | POA: Insufficient documentation

## 2014-08-13 DIAGNOSIS — M79645 Pain in left finger(s): Secondary | ICD-10-CM | POA: Insufficient documentation

## 2014-08-13 DIAGNOSIS — G8929 Other chronic pain: Secondary | ICD-10-CM | POA: Insufficient documentation

## 2014-08-13 DIAGNOSIS — J45909 Unspecified asthma, uncomplicated: Secondary | ICD-10-CM | POA: Insufficient documentation

## 2014-08-13 DIAGNOSIS — Z79899 Other long term (current) drug therapy: Secondary | ICD-10-CM | POA: Insufficient documentation

## 2014-08-13 DIAGNOSIS — Z720 Tobacco use: Secondary | ICD-10-CM | POA: Insufficient documentation

## 2014-08-13 MED ORDER — MELOXICAM 7.5 MG PO TABS: 7.5000 mg | ORAL_TABLET | Freq: Every day | ORAL | Status: DC

## 2014-08-13 MED ORDER — LIDOCAINE-EPINEPHRINE (PF) 2 %-1:200000 IJ SOLN
10.0000 mL | Freq: Once | INTRAMUSCULAR | Status: AC
  Administered 2014-08-13: 10 mL
  Filled 2014-08-13: qty 20

## 2014-08-13 MED ORDER — HYDROCODONE-ACETAMINOPHEN 5-325 MG PO TABS: 1.0000 | ORAL_TABLET | ORAL | Status: DC | PRN

## 2014-08-13 NOTE — ED Notes (Signed)
Pt reports abscess increasing in size x 3 days.  Pt reports having abscess in same area "around 5 years ago."

## 2014-08-13 NOTE — ED Notes (Signed)
Pt escorted to discharge window. Verbalized understanding discharge instructions. In no acute distress.   

## 2014-08-13 NOTE — ED Notes (Signed)
Pt c/o abscess to under rt arm x 4 days.  Also wants her "trigger finger" looked at.

## 2014-08-13 NOTE — ED Provider Notes (Signed)
CSN: [id number]     Arrival date & time 08/13/14  1121 History   First MD Initiated Contact with Patient 08/13/14 1127     Chief Complaint  Patient presents with  . Abscess     (Consider location/radiation/quality/duration/timing/severity/associated sxs/prior Treatment) HPI Pt is a 43yo female presenting to ED with c/o abscess gradually worsening x3 days under her right arm. Hx of same 5 years ago, which required I&D.  Pain is constant, aching and throbbing, worse with warm compresses, 9/10.  Denies fever, n/v/d. No pain medication taken PTA.  Pt also c/o left thumb pain that started about 2 months ago. Pain is intermittent, aching and sore, moderate in severity. Reports a "catching" sensation at times. Pt is left hand dominant. Was advised by a friend it may be trigger finger. No known injuries.   Past Medical History  Diagnosis Date  . Chronic back pain   . Asthma    No past surgical history on file. No family history on file. History  Substance Use Topics  . Smoking status: Current Every Day Smoker -- 0.50 packs/day    Types: Cigarettes  . Smokeless tobacco: Not on file  . Alcohol Use: No   OB History    No data available     Review of Systems  Constitutional: Negative for fever and chills.  Skin: Positive for wound ( abscess in right axilla). Negative for color change and rash.  All other systems reviewed and are negative.     Allergies  Review of patient's allergies indicates no known allergies.  Home Medications   Prior to Admission medications   Medication Sig Start Date End Date Taking? Authorizing Provider  acetaminophen (TYLENOL) 500 MG tablet Take 1,000 mg by mouth every 6 (six) hours as needed for moderate pain.    Historical Provider, MD  albuterol (PROVENTIL HFA;VENTOLIN HFA) 108 (90 BASE) MCG/ACT inhaler Inhale 2 puffs into the lungs every 6 (six) hours as needed for wheezing or shortness of breath.    Historical Provider, MD  cyclobenzaprine  (FLEXERIL) 10 MG tablet Take 1 tablet (10 mg total) by mouth 2 (two) times daily as needed for muscle spasms. 10/14/13   Tiffany Irine SealG Greene, PA-C  HYDROcodone-acetaminophen (NORCO/VICODIN) 5-325 MG per tablet Take 1-2 tablets by mouth every 4 (four) hours as needed for moderate pain or severe pain. 08/13/14   Junius FinnerErin O'Malley, PA-C  meloxicam (MOBIC) 7.5 MG tablet Take 1 tablet (7.5 mg total) by mouth daily. Take 1-2 tabs daily left thumb pain 08/13/14   Junius FinnerErin O'Malley, PA-C   BP 110/61 mmHg  Pulse 92  Temp(Src) 99.2 F (37.3 C) (Oral)  Resp 18  SpO2 100% Physical Exam  Constitutional: She is oriented to person, place, and time. She appears well-developed and well-nourished.  HENT:  Head: Normocephalic and atraumatic.  Eyes: EOM are normal.  Neck: Normal range of motion.  Cardiovascular: Normal rate.   Left thumb: cap refill <3 seconds  Pulmonary/Chest: Effort normal.  Musculoskeletal: She exhibits tenderness. She exhibits no edema.  Left thumb: mildly limited extension compared to right. Mild tenderness to proximal volar aspect of left thumb. No obvious deformity.  Thumb to finger opposition normal.   Neurological: She is alert and oriented to person, place, and time.  Left thumb: sensation to light and sharp touch in tact.  Skin: Skin is warm and dry. No erythema.  Right axilla: 1cm area of induration and tenderness with overlying well healed scar c/w previous I&D.  No erythema, warmth, red streaking  or active drainage  Psychiatric: She has a normal mood and affect. Her behavior is normal.  Nursing note and vitals reviewed.   ED Course  Procedures (including critical care time)  INCISION AND DRAINAGE Performed by: Junius Finner'MALLEY, Presley Gora A. Consent: Verbal consent obtained. Risks and benefits: risks, benefits and alternatives were discussed Type: abscess  Body area: right axilla  Anesthesia: local infiltration  Incision was made with a scalpel.  Local anesthetic: lidocaine 2% with  epinephrine  Anesthetic total: 1.75ml  Complexity: complex Blunt dissection to break up loculations  Drainage: purulent  Drainage amount: 4cc  Packing material: 1/4 in iodoform gauze  Patient tolerance: Patient tolerated the procedure well with no immediate complications.    Labs Review Labs Reviewed - No data to display  Imaging Review No results found.   EKG Interpretation None      MDM   Final diagnoses:  Abscess of axilla, right  Thumb pain, left   Pt presenting to ED with abscess in Right axilla, I&D performed w/o immediate complication.  Advised to f/u in 2-3 days for wound recheck. Home care instructions provided. Rx: norco Pt verbalized understanding and agreement with tx plan.  Pt also request referral to hand specialist for possible trigger finger of left thumb. No known injury. On exam, pt does have mildly limited ROM of left thumb. Thumb is neurovascularly in tact. Do not believe imaging would be of benefit at this time. Will tx conservatively for pain with mobic. Provided contact info for hand specialist, Dr. Melvyn Novasrtmann, for pt to f/u if symptoms not improving in 1-2 weeks. Pt verbalized understanding and agreement with tx plan.    Junius Finnerrin O'Malley, PA-C 08/14/14 1149  Arby BarretteMarcy Pfeiffer, MD 08/17/14 1723

## 2014-09-20 ENCOUNTER — Emergency Department (HOSPITAL_COMMUNITY)
Admission: EM | Admit: 2014-09-20 | Discharge: 2014-09-20 | Disposition: A | Discharge status: Home / Self Care | Payer: 59 | Attending: Emergency Medicine | Admitting: Emergency Medicine

## 2014-09-20 ENCOUNTER — Encounter (HOSPITAL_COMMUNITY): Payer: Self-pay | Admitting: Emergency Medicine

## 2014-09-20 DIAGNOSIS — M542 Cervicalgia: Secondary | ICD-10-CM | POA: Diagnosis not present

## 2014-09-20 DIAGNOSIS — M436 Torticollis: Secondary | ICD-10-CM | POA: Diagnosis not present

## 2014-09-20 DIAGNOSIS — M62838 Other muscle spasm: Secondary | ICD-10-CM | POA: Insufficient documentation

## 2014-09-20 DIAGNOSIS — Z791 Long term (current) use of non-steroidal anti-inflammatories (NSAID): Secondary | ICD-10-CM | POA: Diagnosis not present

## 2014-09-20 DIAGNOSIS — R200 Anesthesia of skin: Secondary | ICD-10-CM | POA: Diagnosis present

## 2014-09-20 DIAGNOSIS — Z79899 Other long term (current) drug therapy: Secondary | ICD-10-CM | POA: Diagnosis not present

## 2014-09-20 DIAGNOSIS — Z720 Tobacco use: Secondary | ICD-10-CM | POA: Insufficient documentation

## 2014-09-20 DIAGNOSIS — G8929 Other chronic pain: Secondary | ICD-10-CM | POA: Insufficient documentation

## 2014-09-20 DIAGNOSIS — J45909 Unspecified asthma, uncomplicated: Secondary | ICD-10-CM | POA: Insufficient documentation

## 2014-09-20 MED ORDER — HYDROCODONE-ACETAMINOPHEN 5-325 MG PO TABS
1.0000 | ORAL_TABLET | Freq: Once | ORAL | Status: AC
  Administered 2014-09-20: 1 via ORAL
  Filled 2014-09-20: qty 1

## 2014-09-20 MED ORDER — DIAZEPAM 5 MG PO TABS: 5.0000 mg | ORAL_TABLET | Freq: Three times a day (TID) | ORAL | Status: AC | PRN

## 2014-09-20 MED ORDER — DIAZEPAM 5 MG PO TABS
5.0000 mg | ORAL_TABLET | Freq: Once | ORAL | Status: AC
  Administered 2014-09-20: 5 mg via ORAL
  Filled 2014-09-20: qty 1

## 2014-09-20 MED ORDER — HYDROCODONE-ACETAMINOPHEN 5-325 MG PO TABS: 1.0000 | ORAL_TABLET | Freq: Four times a day (QID) | ORAL | Status: DC | PRN

## 2014-09-20 NOTE — ED Provider Notes (Addendum)
CSN: [id number]     Arrival date & time 09/20/14  1623 History   First MD Initiated Contact with Patient 09/20/14 1750     Chief Complaint  Patient presents with  . Neck Pain  . Numbness     (Consider location/radiation/quality/duration/timing/severity/associated sxs/prior Treatment) Patient is a 44 y.o. female presenting with neck pain. The history is provided by the patient.  Neck Pain Pain location:  L side Quality:  Aching, cramping, shooting and stiffness Stiffness is present:  At night Pain radiates to:  L arm, L shoulder and L hand Pain severity:  Severe Pain is:  Worse during the night Onset quality:  Gradual Duration:  5 days Timing:  Constant Progression:  Worsening Chronicity:  New Context comment:  Patient has no specific injury however states at work she constantly has her arms above her head Relieved by:  Nothing Worsened by:  Bending and position Ineffective treatments:  Muscle relaxants and ice Associated symptoms: tingling   Associated symptoms: no fever, no headaches, no numbness, no paresis and no weakness   Risk factors: no hx of spinal trauma, no recent head injury and no recurrent falls     Past Medical History  Diagnosis Date  . Chronic back pain   . Asthma    No past surgical history on file. No family history on file. History  Substance Use Topics  . Smoking status: Current Every Day Smoker -- 0.50 packs/day    Types: Cigarettes  . Smokeless tobacco: Not on file  . Alcohol Use: No   OB History    No data available     Review of Systems  Constitutional: Negative for fever.  Musculoskeletal: Positive for neck pain.  Neurological: Positive for tingling. Negative for weakness, numbness and headaches.  All other systems reviewed and are negative.     Allergies  Review of patient's allergies indicates no known allergies.  Home Medications   Prior to Admission medications   Medication Sig Start Date End Date Taking? Authorizing  Provider  acetaminophen (TYLENOL) 500 MG tablet Take 1,000 mg by mouth every 6 (six) hours as needed for moderate pain.   Yes Historical Provider, MD  albuterol (PROVENTIL HFA;VENTOLIN HFA) 108 (90 BASE) MCG/ACT inhaler Inhale 2 puffs into the lungs every 6 (six) hours as needed for wheezing or shortness of breath.   Yes Historical Provider, MD  cyclobenzaprine (FLEXERIL) 10 MG tablet Take 1 tablet (10 mg total) by mouth 2 (two) times daily as needed for muscle spasms. 10/14/13  Yes Tiffany Irine SealG Greene, PA-C  HYDROcodone-acetaminophen (NORCO/VICODIN) 5-325 MG per tablet Take 1-2 tablets by mouth every 4 (four) hours as needed for moderate pain or severe pain. 08/13/14   Junius FinnerErin O'Malley, PA-C  meloxicam (MOBIC) 7.5 MG tablet Take 1 tablet (7.5 mg total) by mouth daily. Take 1-2 tabs daily left thumb pain Patient not taking: Reported on 09/20/2014 08/13/14   Junius FinnerErin O'Malley, PA-C   BP 120/75 mmHg  Pulse 70  Temp(Src) 98.1 F (36.7 C) (Oral)  Resp 18  SpO2 100% Physical Exam  Constitutional: She is oriented to person, place, and time. She appears well-developed and well-nourished. No distress.  HENT:  Head: Normocephalic and atraumatic.  Eyes: EOM are normal. Pupils are equal, round, and reactive to light.  Neck: Muscular tenderness present. No spinous process tenderness present. Decreased range of motion present.    Mild left-sided torticollis as well as significant pain over the left trapezius with palpable spasm  Cardiovascular: Normal rate and intact distal  pulses.   Pulmonary/Chest: Effort normal.  Neurological: She is alert and oriented to person, place, and time. She has normal strength. No sensory deficit.  5 out of 5 hand grip bilaterally  Skin: Skin is warm and dry.  Psychiatric: She has a normal mood and affect. Her behavior is normal.  Nursing note and vitals reviewed.   ED Course  Procedures (including critical care time) Labs Review Labs Reviewed - No data to display  Imaging  Review No results found.   EKG Interpretation None      MDM   Final diagnoses:  Trapezius muscle spasm    Patient here with evidence of left trapezius spasm with some mild numbness of the left upper arm which is most likely nerve irritation. She has no notable weakness and normal pulses present. No concern for cardiac pathology at this time. Patient has been taking Flexeril and Tylenol at home without improvement. Will give patient pain medication and medication for spasm. An patient can follow-up with PCP. She has an appointment with neurosurgery in March 7.    Gwyneth Sprout, MD 09/20/14 1810  Gwyneth Sprout, MD 09/20/14 [phone]

## 2014-09-20 NOTE — ED Notes (Signed)
Pt c/o sharp "nerve" pain that begins in her lt neck and goes down to lt fingers.  States her hands have been numb and tingly.  Sx started on Tuesday.

## 2014-09-20 NOTE — Discharge Instructions (Signed)
Muscle Cramps and Spasms °Muscle cramps and spasms occur when a muscle or muscles tighten and you have no control over this tightening (involuntary muscle contraction). They are a common problem and can develop in any muscle. The most common place is in the calf muscles of the leg. Both muscle cramps and muscle spasms are involuntary muscle contractions, but they also have differences:  °· Muscle cramps are sporadic and painful. They may last a few seconds to a quarter of an hour. Muscle cramps are often more forceful and last longer than muscle spasms. °· Muscle spasms may or may not be painful. They may also last just a few seconds or much longer. °CAUSES  °It is uncommon for cramps or spasms to be due to a serious underlying problem. In many cases, the cause of cramps or spasms is unknown. Some common causes are:  °· Overexertion.   °· Overuse from repetitive motions (doing the same thing over and over).   °· Remaining in a certain position for a long period of time.   °· Improper preparation, form, or technique while performing a sport or activity.   °· Dehydration.   °· Injury.   °· Side effects of some medicines.   °· Abnormally low levels of the salts and ions in your blood (electrolytes), especially potassium and calcium. This could happen if you are taking water pills (diuretics) or you are pregnant.   °Some underlying medical problems can make it more likely to develop cramps or spasms. These include, but are not limited to:  °· Diabetes.   °· Parkinson disease.   °· Hormone disorders, such as thyroid problems.   °· Alcohol abuse.   °· Diseases specific to muscles, joints, and bones.   °· Blood vessel disease where not enough blood is getting to the muscles.   °HOME CARE INSTRUCTIONS  °· Stay well hydrated. Drink enough water and fluids to keep your urine clear or pale yellow. °· It may be helpful to massage, stretch, and relax the affected muscle. °· For tight or tense muscles, use a warm towel, heating  pad, or hot shower water directed to the affected area. °· If you are sore or have pain after a cramp or spasm, applying ice to the affected area may relieve discomfort. °· Put ice in a plastic bag. °· Place a towel between your skin and the bag. °· Leave the ice on for 15-20 minutes, 03-04 times a day. °· Medicines used to treat a known cause of cramps or spasms may help reduce their frequency or severity. Only take over-the-counter or prescription medicines as directed by your caregiver. °SEEK MEDICAL CARE IF:  °Your cramps or spasms get more severe, more frequent, or do not improve over time.  °MAKE SURE YOU:  °· Understand these instructions. °· Will watch your condition. °· Will get help right away if you are not doing well or get worse. °Document Released: 01/21/2002 Document Revised: 11/26/2012 Document Reviewed: 07/18/2012 °[employer]® Patient Information ©2015 [employer]. This information is not intended to replace advice given to you by your health care provider. Make sure you discuss any questions you have with your health care provider. ° °Heat Therapy °Heat therapy can help make painful, stiff muscles and joints feel better. Do not use heat on new injuries. Wait at least 48 hours after an injury to use heat. Do not use heat when you have aches or pains right after an activity. If you still have pain 3 hours after stopping the activity, then you may use heat. °HOME CARE °Wet heat pack °· Soak   a clean towel in warm water. Squeeze out the extra water. °· Put the warm, wet towel in a plastic bag. °· Place a thin, dry towel between your skin and the bag. °· Put the heat pack on the area for 5 minutes, and check your skin. Your skin may be pink, but it should not be red. °· Leave the heat pack on the area for 15 to 30 minutes. °· Repeat this every 2 to 4 hours while awake. Do not use heat while you are sleeping. °Warm water bath °· Fill a tub with warm water. °· Place the affected body part in the  tub. °· Soak the area for 20 to 40 minutes. °· Repeat as needed. °Hot water bottle °· Fill the water bottle half full with hot water. °· Press out the extra air. Close the cap tightly. °· Place a dry towel between your skin and the bottle. °· Put the bottle on the area for 5 minutes, and check your skin. Your skin may be pink, but it should not be red. °· Leave the bottle on the area for 15 to 30 minutes. °· Repeat this every 2 to 4 hours while awake. °Electric heating pad °· Place a dry towel between your skin and the heating pad. °· Set the heating pad on low heat. °· Put the heating pad on the area for 10 minutes, and check your skin. Your skin may be pink, but it should not be red. °· Leave the heating pad on the area for 20 to 40 minutes. °· Repeat this every 2 to 4 hours while awake. °· Do not lie on the heating pad. °· Do not fall asleep while using the heating pad. °· Do not use the heating pad near water. °GET HELP RIGHT AWAY IF: °· You get blisters or red skin. °· Your skin is puffy (swollen), or you lose feeling (numbness) in the affected area. °· You have any new problems. °· Your problems are getting worse. °· You have any questions or concerns. °If you have any problems, stop using heat therapy until you see your doctor. °MAKE SURE YOU: °· Understand these instructions. °· Will watch your condition. °· Will get help right away if you are not doing well or get worse. °Document Released: 10/24/2011 Document Reviewed: 09/24/2013 °[employer]® Patient Information ©2015 [employer]. This information is not intended to replace advice given to you by your health care provider. Make sure you discuss any questions you have with your health care provider. ° °

## 2014-09-24 ENCOUNTER — Emergency Department (HOSPITAL_COMMUNITY)
Admission: EM | Admit: 2014-09-24 | Discharge: 2014-09-24 | Disposition: A | Discharge status: Home / Self Care | Payer: 59 | Attending: Emergency Medicine | Admitting: Emergency Medicine

## 2014-09-24 ENCOUNTER — Encounter (HOSPITAL_COMMUNITY): Payer: Self-pay | Admitting: Emergency Medicine

## 2014-09-24 DIAGNOSIS — M791 Myalgia: Secondary | ICD-10-CM | POA: Insufficient documentation

## 2014-09-24 DIAGNOSIS — M542 Cervicalgia: Secondary | ICD-10-CM | POA: Diagnosis not present

## 2014-09-24 DIAGNOSIS — Z720 Tobacco use: Secondary | ICD-10-CM | POA: Diagnosis not present

## 2014-09-24 DIAGNOSIS — Z79899 Other long term (current) drug therapy: Secondary | ICD-10-CM | POA: Diagnosis not present

## 2014-09-24 DIAGNOSIS — J45909 Unspecified asthma, uncomplicated: Secondary | ICD-10-CM | POA: Diagnosis not present

## 2014-09-24 DIAGNOSIS — M79602 Pain in left arm: Secondary | ICD-10-CM | POA: Diagnosis present

## 2014-09-24 DIAGNOSIS — S46812D Strain of other muscles, fascia and tendons at shoulder and upper arm level, left arm, subsequent encounter: Secondary | ICD-10-CM | POA: Diagnosis not present

## 2014-09-24 DIAGNOSIS — G8929 Other chronic pain: Secondary | ICD-10-CM | POA: Diagnosis not present

## 2014-09-24 MED ORDER — METHOCARBAMOL 1000 MG/10ML IJ SOLN
500.0000 mg | Freq: Once | INTRAMUSCULAR | Status: AC
  Administered 2014-09-24: 500 mg via INTRAMUSCULAR
  Filled 2014-09-24: qty 5

## 2014-09-24 MED ORDER — DICLOFENAC SODIUM 50 MG PO TBEC: 50.0000 mg | DELAYED_RELEASE_TABLET | Freq: Three times a day (TID) | ORAL | Status: AC

## 2014-09-24 MED ORDER — HYDROCODONE-ACETAMINOPHEN 5-325 MG PO TABS
2.0000 | ORAL_TABLET | Freq: Once | ORAL | Status: AC
  Administered 2014-09-24: 2 via ORAL
  Filled 2014-09-24: qty 2

## 2014-09-24 MED ORDER — HYDROCODONE-ACETAMINOPHEN 5-325 MG PO TABS: 1.0000 | ORAL_TABLET | ORAL | Status: DC | PRN

## 2014-09-24 NOTE — Discharge Instructions (Signed)
Return to the emergency room with worsening of symptoms, new symptoms or with symptoms that are concerning, especially numbness, tingling ,weakness, swelling, redness, fevers, worsening pain. RICE: Rest, Ice (three cycles of 20 mins on, off at least twice a day), compression/brace, elevation. Heating pad works well for back pain. Diclofenac as prescribed norco for severe pain. Do not operate machinery, drive or drink alcohol while taking narcotics or muscle relaxers. Make sure to gently stretch her neck throughout the day. Follow-up with neurosurgery as scheduled Call to make appointment with the wellness center to establish care with a primary care provider. Number provided above.  Cervical Strain and Sprain (Whiplash) with Rehab Cervical strain and sprain are injuries that commonly occur with "whiplash" injuries. Whiplash occurs when the neck is forcefully whipped backward or forward, such as during a motor vehicle accident or during contact sports. The muscles, ligaments, tendons, discs, and nerves of the neck are susceptible to injury when this occurs. RISK FACTORS Risk of having a whiplash injury increases if:  Osteoarthritis of the spine.  Situations that make head or neck accidents or trauma more likely.  High-risk sports (football, rugby, wrestling, hockey, auto racing, gymnastics, diving, contact karate, or boxing).  Poor strength and flexibility of the neck.  Previous neck injury.  Poor tackling technique.  Improperly fitted or padded equipment. SYMPTOMS   Pain or stiffness in the front or back of neck or both.  Symptoms may present immediately or up to 24 hours after injury.  Dizziness, headache, nausea, and vomiting.  Muscle spasm with soreness and stiffness in the neck.  Tenderness and swelling at the injury site. PREVENTION  Learn and use proper technique (avoid tackling with the head, spearing, and head-butting; use proper falling techniques to avoid  landing on the head).  Warm up and stretch properly before activity.  Maintain physical fitness:  Strength, flexibility, and endurance.  Cardiovascular fitness.  Wear properly fitted and padded protective equipment, such as padded soft collars, for participation in contact sports. PROGNOSIS  Recovery from cervical strain and sprain injuries is dependent on the extent of the injury. These injuries are usually curable in 1 week to 3 months with appropriate treatment.  RELATED COMPLICATIONS   Temporary numbness and weakness may occur if the nerve roots are damaged, and this may persist until the nerve has completely healed.  Chronic pain due to frequent recurrence of symptoms.  Prolonged healing, especially if activity is resumed too soon (before complete recovery). TREATMENT  Treatment initially involves the use of ice and medication to help reduce pain and inflammation. It is also important to perform strengthening and stretching exercises and modify activities that worsen symptoms so the injury does not get worse. These exercises may be performed at home or with a therapist. For patients who experience severe symptoms, a soft, padded collar may be recommended to be worn around the neck.  Improving your posture may help reduce symptoms. Posture improvement includes pulling your chin and abdomen in while sitting or standing. If you are sitting, sit in a firm chair with your buttocks against the back of the chair. While sleeping, try replacing your pillow with a small towel rolled to 2 inches in diameter, or use a cervical pillow or soft cervical collar. Poor sleeping positions delay healing.  For patients with nerve root damage, which causes numbness or weakness, the use of a cervical traction apparatus may be recommended. Surgery is rarely necessary for these injuries. However, cervical strain and sprains that are present  at birth (congenital) may require surgery. MEDICATION   If pain  medication is necessary, nonsteroidal anti-inflammatory medications, such as aspirin and ibuprofen, or other minor pain relievers, such as acetaminophen, are often recommended.  Do not take pain medication for 7 days before surgery.  Prescription pain relievers may be given if deemed necessary by your caregiver. Use only as directed and only as much as you need. HEAT AND COLD:   Cold treatment (icing) relieves pain and reduces inflammation. Cold treatment should be applied for 10 to 15 minutes every 2 to 3 hours for inflammation and pain and immediately after any activity that aggravates your symptoms. Use ice packs or an ice massage.  Heat treatment may be used prior to performing the stretching and strengthening activities prescribed by your caregiver, physical therapist, or athletic trainer. Use a heat pack or a warm soak. SEEK MEDICAL CARE IF:   Symptoms get worse or do not improve in 2 weeks despite treatment.  New, unexplained symptoms develop (drugs used in treatment may produce side effects). EXERCISES RANGE OF MOTION (ROM) AND STRETCHING EXERCISES - Cervical Strain and Sprain These exercises may help you when beginning to rehabilitate your injury. In order to successfully resolve your symptoms, you must improve your posture. These exercises are designed to help reduce the forward-head and rounded-shoulder posture which contributes to this condition. Your symptoms may resolve with or without further involvement from your physician, physical therapist or athletic trainer. While completing these exercises, remember:   Restoring tissue flexibility helps normal motion to return to the joints. This allows healthier, less painful movement and activity.  An effective stretch should be held for at least 20 seconds, although you may need to begin with shorter hold times for comfort.  A stretch should never be painful. You should only feel a gentle lengthening or release in the stretched  tissue. STRETCH- Axial Extensors  Lie on your back on the floor. You may bend your knees for comfort. Place a rolled-up hand towel or dish towel, about 2 inches in diameter, under the part of your head that makes contact with the floor.  Gently tuck your chin, as if trying to make a "double chin," until you feel a gentle stretch at the base of your head.  Hold __________ seconds. Repeat __________ times. Complete this exercise __________ times per day.  STRETCH - Axial Extension   Stand or sit on a firm surface. Assume a good posture: chest up, shoulders drawn back, abdominal muscles slightly tense, knees unlocked (if standing) and feet hip width apart.  Slowly retract your chin so your head slides back and your chin slightly lowers. Continue to look straight ahead.  You should feel a gentle stretch in the back of your head. Be certain not to feel an aggressive stretch since this can cause headaches later.  Hold for __________ seconds. Repeat __________ times. Complete this exercise __________ times per day. STRETCH - Cervical Side Bend   Stand or sit on a firm surface. Assume a good posture: chest up, shoulders drawn back, abdominal muscles slightly tense, knees unlocked (if standing) and feet hip width apart.  Without letting your nose or shoulders move, slowly tip your right / left ear to your shoulder until your feel a gentle stretch in the muscles on the opposite side of your neck.  Hold __________ seconds. Repeat __________ times. Complete this exercise __________ times per day. STRETCH - Cervical Rotators   Stand or sit on a firm surface. Assume a good  posture: chest up, shoulders drawn back, abdominal muscles slightly tense, knees unlocked (if standing) and feet hip width apart.  Keeping your eyes level with the ground, slowly turn your head until you feel a gentle stretch along the back and opposite side of your neck.  Hold __________ seconds. Repeat __________ times.  Complete this exercise __________ times per day. RANGE OF MOTION - Neck Circles   Stand or sit on a firm surface. Assume a good posture: chest up, shoulders drawn back, abdominal muscles slightly tense, knees unlocked (if standing) and feet hip width apart.  Gently roll your head down and around from the back of one shoulder to the back of the other. The motion should never be forced or painful.  Repeat the motion 10-20 times, or until you feel the neck muscles relax and loosen. Repeat __________ times. Complete the exercise __________ times per day. STRENGTHENING EXERCISES - Cervical Strain and Sprain These exercises may help you when beginning to rehabilitate your injury. They may resolve your symptoms with or without further involvement from your physician, physical therapist, or athletic trainer. While completing these exercises, remember:   Muscles can gain both the endurance and the strength needed for everyday activities through controlled exercises.  Complete these exercises as instructed by your physician, physical therapist, or athletic trainer. Progress the resistance and repetitions only as guided.  You may experience muscle soreness or fatigue, but the pain or discomfort you are trying to eliminate should never worsen during these exercises. If this pain does worsen, stop and make certain you are following the directions exactly. If the pain is still present after adjustments, discontinue the exercise until you can discuss the trouble with your clinician. STRENGTH - Cervical Flexors, Isometric  Face a wall, standing about 6 inches away. Place a small pillow, a ball about 6-8 inches in diameter, or a folded towel between your forehead and the wall.  Slightly tuck your chin and gently push your forehead into the soft object. Push only with mild to moderate intensity, building up tension gradually. Keep your jaw and forehead relaxed.  Hold 10 to 20 seconds. Keep your breathing  relaxed.  Release the tension slowly. Relax your neck muscles completely before you start the next repetition. Repeat __________ times. Complete this exercise __________ times per day. STRENGTH- Cervical Lateral Flexors, Isometric   Stand about 6 inches away from a wall. Place a small pillow, a ball about 6-8 inches in diameter, or a folded towel between the side of your head and the wall.  Slightly tuck your chin and gently tilt your head into the soft object. Push only with mild to moderate intensity, building up tension gradually. Keep your jaw and forehead relaxed.  Hold 10 to 20 seconds. Keep your breathing relaxed.  Release the tension slowly. Relax your neck muscles completely before you start the next repetition. Repeat __________ times. Complete this exercise __________ times per day. STRENGTH - Cervical Extensors, Isometric   Stand about 6 inches away from a wall. Place a small pillow, a ball about 6-8 inches in diameter, or a folded towel between the back of your head and the wall.  Slightly tuck your chin and gently tilt your head back into the soft object. Push only with mild to moderate intensity, building up tension gradually. Keep your jaw and forehead relaxed.  Hold 10 to 20 seconds. Keep your breathing relaxed.  Release the tension slowly. Relax your neck muscles completely before you start the next repetition. Repeat __________  times. Complete this exercise __________ times per day. POSTURE AND BODY MECHANICS CONSIDERATIONS - Cervical Strain and Sprain Keeping correct posture when sitting, standing or completing your activities will reduce the stress put on different body tissues, allowing injured tissues a chance to heal and limiting painful experiences. The following are general guidelines for improved posture. Your physician or physical therapist will provide you with any instructions specific to your needs. While reading these guidelines, remember:  The exercises  prescribed by your provider will help you have the flexibility and strength to maintain correct postures.  The correct posture provides the optimal environment for your joints to work. All of your joints have less wear and tear when properly supported by a spine with good posture. This means you will experience a healthier, less painful body.  Correct posture must be practiced with all of your activities, especially prolonged sitting and standing. Correct posture is as important when doing repetitive low-stress activities (typing) as it is when doing a single heavy-load activity (lifting). PROLONGED STANDING WHILE SLIGHTLY LEANING FORWARD When completing a task that requires you to lean forward while standing in one place for a long time, place either foot up on a stationary 2- to 4-inch high object to help maintain the best posture. When both feet are on the ground, the low back tends to lose its slight inward curve. If this curve flattens (or becomes too large), then the back and your other joints will experience too much stress, fatigue more quickly, and can cause pain.  RESTING POSITIONS Consider which positions are most painful for you when choosing a resting position. If you have pain with flexion-based activities (sitting, bending, stooping, squatting), choose a position that allows you to rest in a less flexed posture. You would want to avoid curling into a fetal position on your side. If your pain worsens with extension-based activities (prolonged standing, working overhead), avoid resting in an extended position such as sleeping on your stomach. Most people will find more comfort when they rest with their spine in a more neutral position, neither too rounded nor too arched. Lying on a non-sagging bed on your side with a pillow between your knees, or on your back with a pillow under your knees will often provide some relief. Keep in mind, being in any one position for a prolonged period of time, no  matter how correct your posture, can still lead to stiffness. WALKING Walk with an upright posture. Your ears, shoulders, and hips should all line up. OFFICE WORK When working at a desk, create an environment that supports good, upright posture. Without extra support, muscles fatigue and lead to excessive strain on joints and other tissues. CHAIR:  A chair should be able to slide under your desk when your back makes contact with the back of the chair. This allows you to work closely.  The chair's height should allow your eyes to be level with the upper part of your monitor and your hands to be slightly lower than your elbows.  Body position:  Your feet should make contact with the floor. If this is not possible, use a foot rest.  Keep your ears over your shoulders. This will reduce stress on your neck and low back. Document Released: 08/01/2005 Document Revised: 12/16/2013 Document Reviewed: 11/13/2008 [facility] Patient Information 2015 [place], [place]. This information is not intended to replace advice given to you by your health care provider. Make sure you discuss any questions you have with your health care provider.

## 2014-09-24 NOTE — ED Provider Notes (Signed)
CSN: [id number]     Arrival date & time 09/24/14  1745 History  This chart was scribed for non-physician practitioner Oswaldo Conroy, PA-C working with Richardean Canal, MD by Conchita Paris, ED Scribe. This patient was seen in [facility] and the patient's care was started at 7:08 PM.   Chief Complaint  Patient presents with  . Torticollis  . Arm Pain   Patient is a 44 y.o. female presenting with arm pain. The history is provided by the patient. No language interpreter was used.  Arm Pain Pertinent negatives include no chest pain and no shortness of breath.    HPI Comments: Shelly Villanueva is a 44 y.o. female who presents to the Emergency Department complaining of left shoulder pain that radiates up her neck and down her arm ongoing for the past 2 nights. She has  tingling in her left arm and hand. Pt is unable to sleep due to the pain and is only sleeping for 2-3 hours. She was seen 4 days ago for the same complaint, and given medication for the pain and spasms. She is out of her pain medicines. No trauma or MVC. She cannot take ibuprophen, it gives her palpations and naproxen makes her dizzy. 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 No fever, chills, nausea, vomiting, diarrhea, CP and SOB.  Past Medical History  Diagnosis Date  . Chronic back pain   . Asthma    History reviewed. No pertinent past surgical history. No family history on file. History  Substance Use Topics  . Smoking status: Current Every Day Smoker -- 0.50 packs/day    Types: Cigarettes  . Smokeless tobacco: Not on file  . Alcohol Use: No   OB History    No data available     Review of Systems  Constitutional: Negative for fever and chills.  Respiratory: Negative for chest tightness and shortness of breath.   Cardiovascular: Negative for chest pain and palpitations.  Gastrointestinal: Negative for nausea, vomiting and diarrhea.  Musculoskeletal: Positive for myalgias and  neck pain.    Allergies  Review of patient's allergies indicates no known allergies.  Home Medications   Prior to Admission medications   Medication Sig Start Date End Date Taking? Authorizing Provider  acetaminophen (TYLENOL) 500 MG tablet Take 1,000 mg by mouth every 6 (six) hours as needed for moderate pain.   Yes Historical Provider, MD  albuterol (PROVENTIL HFA;VENTOLIN HFA) 108 (90 BASE) MCG/ACT inhaler Inhale 2 puffs into the lungs every 6 (six) hours as needed for wheezing or shortness of breath.   Yes Historical Provider, MD  cyclobenzaprine (FLEXERIL) 10 MG tablet Take 1 tablet (10 mg total) by mouth 2 (two) times daily as needed for muscle spasms. 10/14/13  Yes Tiffany Irine Seal, PA-C  diazepam (VALIUM) 5 MG tablet Take 1-2 tablets (5-10 mg total) by mouth every 8 (eight) hours as needed for muscle spasms. 09/20/14  Yes Gwyneth Sprout, MD  diclofenac (VOLTAREN) 50 MG EC tablet Take 1 tablet (50 mg total) by mouth 3 (three) times daily. 09/24/14   Louann Sjogren, PA-C  HYDROcodone-acetaminophen (NORCO/VICODIN) 5-325 MG per tablet Take 1-2 tablets by mouth every 4 (four) hours as needed for moderate pain or severe pain. 09/24/14   Louann Sjogren, PA-C  meloxicam (MOBIC) 7.5 MG tablet Take 1 tablet (7.5 mg total) by mouth daily. Take 1-2 tabs daily left thumb pain Patient not taking: Reported on 09/20/2014 08/13/14  Junius FinnerErin O'Malley, PA-C   BP 122/70 mmHg  Pulse 90  Temp(Src) 98.5 F (36.9 C) (Oral)  Resp 16  SpO2 100% Physical Exam  Constitutional: She appears well-developed and well-nourished. No distress.  HENT:  Head: Normocephalic and atraumatic.  Eyes: Conjunctivae and EOM are normal. Right eye exhibits no discharge. Left eye exhibits no discharge.  Cardiovascular: Normal rate, regular rhythm and normal heart sounds.   Pulses:      Radial pulses are 2+ on the right side, and 2+ on the left side.  Pulmonary/Chest: Effort normal and breath sounds normal. No respiratory  distress. She has no wheezes.  Abdominal: Soft. Bowel sounds are normal. She exhibits no distension. There is no tenderness.  Musculoskeletal:  No midline neck tenderness. Full neck rotation to the right, 45 degree rotation to left  Tenderness and distribution of left trapezius. No swelling of left arm or erythema. Normal range of motion.  Neurological: She is alert. She exhibits normal muscle tone. Coordination normal.  5/5 strength in upper extremities. Sensation intact.  Skin: Skin is warm and dry. She is not diaphoretic.  Nursing note and vitals reviewed.   ED Course  Procedures  DIAGNOSTIC STUDIES: Oxygen Saturation is 99% on room air, normal by my interpretation.    COORDINATION OF CARE: 7:20 PM Discussed treatment plan with pt at bedside and pt agreed to plan.  Labs Review Labs Reviewed - No data to display  Imaging Review No results found.   EKG Interpretation None      MDM   Final diagnoses:  Trapezius muscle strain, left, subsequent encounter   Exam non-concerning for spine/nerve injury due to no trauma, repetitive motions at work, no midline tenderness and bilateral upper extremities neurovascularly intact. No imaging indicated. Patient given Robaxin and Vicodin in ED with improvement of her symptoms. Patient stable for discharge and outpatient workup. Patient with neurosurgery appointment on March 7. Stressed the importance of attending this. Refilled her prescription for Norco. Driving and sedation precautions provided. She is given a prescription for diclofenac.  Discussed return precautions with patient. Discussed all results and patient verbalizes understanding and agrees with plan.  Discussed return precautions with patient. Discussed all results and patient verbalizes understanding and agrees with plan.  I personally performed the services described in this documentation, which was scribed in my presence. The recorded information has been reviewed and is  accurate.   Louann SjogrenVictoria L Lacresia Darwish, PA-C 09/24/14 2026  Richardean Canalavid H Yao, MD 09/25/14 [phone]

## 2014-09-24 NOTE — ED Notes (Addendum)
Pt states that she was seen here 4 days ago for same symptoms.  Pt c/o neck stiffness, left arm pain. Pt states that they gave her muscle relaxors which she has been taking but still having pain.

## 2015-09-17 ENCOUNTER — Other Ambulatory Visit: Payer: Self-pay | Admitting: Family Medicine

## 2015-09-17 ENCOUNTER — Ambulatory Visit
Admission: RE | Admit: 2015-09-17 | Discharge: 2015-09-17 | Disposition: A | Discharge status: Home / Self Care | Payer: BLUE CROSS/BLUE SHIELD | Source: Ambulatory Visit | Attending: Family Medicine | Admitting: Family Medicine

## 2015-09-17 DIAGNOSIS — M5489 Other dorsalgia: Secondary | ICD-10-CM

## 2015-09-30 ENCOUNTER — Emergency Department (HOSPITAL_COMMUNITY)
Admission: EM | Admit: 2015-09-30 | Discharge: 2015-09-30 | Disposition: A | Discharge status: Home / Self Care | Payer: BLUE CROSS/BLUE SHIELD | Attending: Emergency Medicine | Admitting: Emergency Medicine

## 2015-09-30 ENCOUNTER — Encounter (HOSPITAL_COMMUNITY): Payer: Self-pay | Admitting: *Deleted

## 2015-09-30 DIAGNOSIS — J45909 Unspecified asthma, uncomplicated: Secondary | ICD-10-CM | POA: Diagnosis not present

## 2015-09-30 DIAGNOSIS — F17210 Nicotine dependence, cigarettes, uncomplicated: Secondary | ICD-10-CM | POA: Diagnosis not present

## 2015-09-30 DIAGNOSIS — G8929 Other chronic pain: Secondary | ICD-10-CM | POA: Insufficient documentation

## 2015-09-30 DIAGNOSIS — M549 Dorsalgia, unspecified: Secondary | ICD-10-CM | POA: Diagnosis not present

## 2015-09-30 DIAGNOSIS — Z880 Allergy status to penicillin: Secondary | ICD-10-CM | POA: Insufficient documentation

## 2015-09-30 DIAGNOSIS — M5432 Sciatica, left side: Secondary | ICD-10-CM

## 2015-09-30 DIAGNOSIS — K0889 Other specified disorders of teeth and supporting structures: Secondary | ICD-10-CM | POA: Diagnosis present

## 2015-09-30 DIAGNOSIS — K047 Periapical abscess without sinus: Secondary | ICD-10-CM | POA: Diagnosis not present

## 2015-09-30 DIAGNOSIS — Z791 Long term (current) use of non-steroidal anti-inflammatories (NSAID): Secondary | ICD-10-CM | POA: Diagnosis not present

## 2015-09-30 DIAGNOSIS — Z79899 Other long term (current) drug therapy: Secondary | ICD-10-CM | POA: Diagnosis not present

## 2015-09-30 MED ORDER — OXYCODONE-ACETAMINOPHEN 5-325 MG PO TABS: 1.0000 | ORAL_TABLET | ORAL | Status: AC | PRN

## 2015-09-30 MED ORDER — CYCLOBENZAPRINE HCL 10 MG PO TABS: 10.0000 mg | ORAL_TABLET | Freq: Two times a day (BID) | ORAL | Status: AC | PRN

## 2015-09-30 MED ORDER — CLINDAMYCIN HCL 150 MG PO CAPS: 300.0000 mg | ORAL_CAPSULE | Freq: Three times a day (TID) | ORAL | Status: AC

## 2015-09-30 NOTE — ED Notes (Signed)
Pt in lobby questioning time.  Returned chart to waiting.

## 2015-09-30 NOTE — ED Notes (Signed)
Pt reports chronic back pain and left lower dental pain.

## 2015-09-30 NOTE — ED Notes (Signed)
Called for patient. No response. 

## 2015-09-30 NOTE — Discharge Instructions (Signed)
Dental Abscess A dental abscess is a collection of pus in or around a tooth. CAUSES This condition is caused by a bacterial infection around the root of the tooth that involves the inner part of the tooth (pulp). It may result from:  Severe tooth decay.  Trauma to the tooth that allows bacteria to enter into the pulp, such as a broken or chipped tooth.  Severe gum disease around a tooth. SYMPTOMS Symptoms of this condition include:  Severe pain in and around the infected tooth.  Swelling and redness around the infected tooth, in the mouth, or in the face.  Tenderness.  Pus drainage.  Bad breath.  Bitter taste in the mouth.  Difficulty swallowing.  Difficulty opening the mouth.  Nausea.  Vomiting.  Chills.  Swollen neck glands.  Fever. DIAGNOSIS This condition is diagnosed with examination of the infected tooth. During the exam, your dentist may tap on the infected tooth. Your dentist will also ask about your medical and dental history and may order X-rays. TREATMENT This condition is treated by eliminating the infection. This may be done with:  Antibiotic medicine.  A root canal. This may be performed to save the tooth.  Pulling (extracting) the tooth. This may also involve draining the abscess. This is done if the tooth cannot be saved. HOME CARE INSTRUCTIONS  Take medicines only as directed by your dentist.  If you were prescribed antibiotic medicine, finish all of it even if you start to feel better.  Rinse your mouth (gargle) often with salt water to relieve pain or swelling.  Do not drive or operate heavy machinery while taking pain medicine.  Do not apply heat to the outside of your mouth.  Keep all follow-up visits as directed by your dentist. This is important. SEEK MEDICAL CARE IF:  Your pain is worse and is not helped by medicine. SEEK IMMEDIATE MEDICAL CARE IF:  You have a fever or chills.  Your symptoms suddenly get worse.  You have a  very bad headache.  You have problems breathing or swallowing.  You have trouble opening your mouth.  You have swelling in your neck or around your eye.   This information is not intended to replace advice given to you by your health care provider. Make sure you discuss any questions you have with your health care provider.   Document Released: 08/01/2005 Document Revised: 12/16/2014 Document Reviewed: 07/29/2014 Elsevier Interactive Patient Education 2016 Elsevier Inc.  [facility]  [facility]  [address]  [place], [place] [address]  Phone [phone]  [facility] [employer] [facility] [place] exemplifies the [employer] vision to improve the health and quality of life of all [place]ians by Public house manager with a passion to care for the underserved and by leading the nation in community-based, service learning oral health education. We are committed to offering comprehensive general dental services for adults, children and special needs patients in a safe, caring and professional setting.  Appointments: Our clinic is open Monday through Friday 8:00 a.m. until 5:00 p.m. The amount of time scheduled for an appointment depends on the patients specific needs. We ask that you keep your appointed time for care or provide 24-hour notice of all appointment changes. Parents or legal guardians must accompany minor children.  Payment for Services: Medicaid and other insurance plans are welcome. Payment for services is due when services are  rendered and may be made by cash or credit card. If you have dental insurance, we will assist you with your claim submission.   Emergencies: Emergency services will be provided Monday through Friday on a walk-in basis. Please arrive early for emergency services. After hours emergency services  will be provided for patients of record as required.  Services:  Comprehensive General Dentistry  Childrens Dentistry  Oral Surgery - Extractions  Root Canals  Sealants and Tooth Colored Fillings  Crowns and Bridges  Dentures and Partial Dentures  Implant Services  Periodontal Services and Cleanings  Cosmetic Tooth Whitening  Digital Radiography  3-D/Cone Beam Imaging   Sciatica Sciatica is pain, weakness, numbness, or tingling along the path of the sciatic nerve. The nerve starts in the lower back and runs down the back of each leg. The nerve controls the muscles in the lower leg and in the back of the knee, while also providing sensation to the back of the thigh, lower leg, and the sole of your foot. Sciatica is a symptom of another medical condition. For instance, nerve damage or certain conditions, such as a herniated disk or bone spur on the spine, pinch or put pressure on the sciatic nerve. This causes the pain, weakness, or other sensations normally associated with sciatica. Generally, sciatica only affects one side of the body. CAUSES   Herniated or slipped disc.  Degenerative disk disease.  A pain disorder involving the narrow muscle in the buttocks (piriformis syndrome).  Pelvic injury or fracture.  Pregnancy.  Tumor (rare). SYMPTOMS  Symptoms can vary from mild to very severe. The symptoms usually travel from the low back to the buttocks and down the back of the leg. Symptoms can include:  Mild tingling or dull aches in the lower back, leg, or hip.  Numbness in the back of the calf or sole of the foot.  Burning sensations in the lower back, leg, or hip.  Sharp pains in the lower back, leg, or hip.  Leg weakness.  Severe back pain inhibiting movement. These symptoms may get worse with coughing, sneezing, laughing, or prolonged sitting or standing. Also, being overweight may worsen symptoms. DIAGNOSIS  Your caregiver will perform a physical exam to look for  common symptoms of sciatica. He or she may ask you to do certain movements or activities that would trigger sciatic nerve pain. Other tests may be performed to find the cause of the sciatica. These may include:  Blood tests.  X-rays.  Imaging tests, such as an MRI or CT scan. TREATMENT  Treatment is directed at the cause of the sciatic pain. Sometimes, treatment is not necessary and the pain and discomfort goes away on its own. If treatment is needed, your caregiver may suggest:  Over-the-counter medicines to relieve pain.  Prescription medicines, such as anti-inflammatory medicine, muscle relaxants, or narcotics.  Applying heat or ice to the painful area.  Steroid injections to lessen pain, irritation, and inflammation around the nerve.  Reducing activity during periods of pain.  Exercising and stretching to strengthen your abdomen and improve flexibility of your spine. Your caregiver may suggest losing weight if the extra weight makes the back pain worse.  Physical therapy.  Surgery to eliminate what is pressing or pinching the nerve, such as a bone spur or part of a herniated disk. HOME CARE INSTRUCTIONS   Only take over-the-counter or prescription medicines for pain or discomfort as directed by your caregiver.  Apply ice to the affected area for 20 minutes, 3-4  times a day for the first 48-72 hours. Then try heat in the same way.  Exercise, stretch, or perform your usual activities if these do not aggravate your pain.  Attend physical therapy sessions as directed by your caregiver.  Keep all follow-up appointments as directed by your caregiver.  Do not wear high heels or shoes that do not provide proper support.  Check your mattress to see if it is too soft. A firm mattress may lessen your pain and discomfort. SEEK IMMEDIATE MEDICAL CARE IF:   You lose control of your bowel or bladder (incontinence).  You have increasing weakness in the lower back, pelvis, buttocks, or  legs.  You have redness or swelling of your back.  You have a burning sensation when you urinate.  You have pain that gets worse when you lie down or awakens you at night.  Your pain is worse than you have experienced in the past.  Your pain is lasting longer than 4 weeks.  You are suddenly losing weight without reason. MAKE SURE YOU:  Understand these instructions.  Will watch your condition.  Will get help right away if you are not doing well or get worse.   This information is not intended to replace advice given to you by your health care provider. Make sure you discuss any questions you have with your health care provider.   Document Released: 07/26/2001 Document Revised: 04/22/2015 Document Reviewed: 12/11/2011 Elsevier Interactive Patient Education [employer]

## 2015-09-30 NOTE — ED Provider Notes (Signed)
CSN: [id number]     Arrival date & time 09/30/15  1208 History  By signing my name below, I, Freida Busman, attest that this documentation has been prepared under the direction and in the presence of non-physician practitioner, Arthor Captain, PA-C. Electronically Signed: Freida Busman, Scribe. 09/30/2015. 4:32 PM.    Chief Complaint  Patient presents with  . Back Pain  . Dental Pain     The history is provided by the patient. No language interpreter was used.   HPI Comments:  Shelly Villanueva is a 45 y.o. female with a history of chronic back pain, who presents to the Emergency Department complaining of increased back pain x a few days. Her pain is her typical back pain.  She reports associated shooting pain down her posterior LLE. She denies bowel/bladder incontinence. PCP has referred pt to a chiropractor but she has not yet followed up.  Pt also complains 10/10 left lower dental pain x  2 days. No alleviating factors noted. No fever or chills.  Pt is an occasional smoker.  Past Medical History  Diagnosis Date  . Chronic back pain   . Asthma    History reviewed. No pertinent past surgical history. History reviewed. No pertinent family history. Social History  Substance Use Topics  . Smoking status: Current Every Day Smoker -- 0.50 packs/day    Types: Cigarettes  . Smokeless tobacco: None  . Alcohol Use: No   OB History    No data available     Review of Systems  Constitutional: Negative for fever and chills.  HENT: Positive for dental problem. Negative for sore throat and trouble swallowing.   Musculoskeletal: Positive for back pain.    Allergies  Penicillins  Home Medications   Prior to Admission medications   Medication Sig Start Date End Date Taking? Authorizing Provider  albuterol (PROVENTIL HFA;VENTOLIN HFA) 108 (90 BASE) MCG/ACT inhaler Inhale 2 puffs into the lungs every 6 (six) hours as needed for wheezing or shortness of breath.    Historical Provider, MD   clindamycin (CLEOCIN) 150 MG capsule Take 2 capsules (300 mg total) by mouth 3 (three) times daily. 09/30/15   Arthor Captain, PA-C  cyclobenzaprine (FLEXERIL) 10 MG tablet Take 1 tablet (10 mg total) by mouth 2 (two) times daily as needed for muscle spasms. 09/30/15   Arthor Captain, PA-C  diazepam (VALIUM) 5 MG tablet Take 1-2 tablets (5-10 mg total) by mouth every 8 (eight) hours as needed for muscle spasms. 09/20/14   Gwyneth Sprout, MD  diclofenac (VOLTAREN) 50 MG EC tablet Take 1 tablet (50 mg total) by mouth 3 (three) times daily. 09/24/14   Oswaldo Conroy, PA-C  meloxicam (MOBIC) 7.5 MG tablet Take 1 tablet (7.5 mg total) by mouth daily. Take 1-2 tabs daily left thumb pain Patient not taking: Reported on 09/20/2014 08/13/14   Junius Finner, PA-C  oxyCODONE-acetaminophen (PERCOCET) 5-325 MG tablet Take 1-2 tablets by mouth every 4 (four) hours as needed. 09/30/15   Theora Vankirk, PA-C   BP 117/52 mmHg  Pulse 71  Temp(Src) 97.8 F (36.6 C) (Oral)  Resp 20  SpO2 100%  LMP 09/08/2015 Physical Exam  Constitutional: She is oriented to person, place, and time. She appears well-developed and well-nourished. No distress.  HENT:  Head: Normocephalic and atraumatic.  Taurus pallidus Periapical abscess with fluctuance at tooth #19  Eyes: Conjunctivae are normal.  Cardiovascular: Normal rate.   Pulmonary/Chest: Effort normal.  Abdominal: She exhibits no distension.  Neurological: She is alert and  oriented to person, place, and time.  Skin: Skin is warm and dry.  Psychiatric: She has a normal mood and affect.  Nursing note and vitals reviewed.   ED Course  Procedures   DIAGNOSTIC STUDIES:  Oxygen Saturation is 100% on RA, normal by my interpretation.    COORDINATION OF CARE:  4:29 PM Discussed treatment plan with pt at bedside and pt agreed to plan.   MDM   Final diagnoses:  Acute periapical abscess  Sciatica of left side       Patient with dentalgia and chronic back  pain.  No abscess requiring immediate incision and drainage.  No neurological deficits and normal neuro exam.  Exam not concerning for Ludwig's angina or pharyngeal abscess. No loss of bowel or bladder control.  No concern for cauda equina.  No urinary symptoms suggestive of UTI. Patient is ambulatory.  Will treat with clindamycin. Pt instructed to follow-up with dentist and chiropractor.  Discussed return precautions. Pt safe for discharge.   I personally performed the services described in this documentation, which was scribed in my presence. The recorded information has been reviewed and is accurate.      Arthor Captain, PA-C 09/30/15 1635  Azalia Bilis, MD 09/30/15 [phone]

## 2016-04-29 ENCOUNTER — Other Ambulatory Visit: Payer: Self-pay | Admitting: Obstetrics & Gynecology

## 2016-05-02 LAB — CYTOLOGY - PAP

## 2017-01-16 IMAGING — CR DG HIP (WITH OR WITHOUT PELVIS) 2-3V*R*
2 series · 2 of 2 positions shown · non-contrast
Comparison: None.

CLINICAL DATA: Low back pain radiating to the right hip, no acute
injury

EXAM:
DG HIP (WITH OR WITHOUT PELVIS) 2-3V RIGHT

[w pelvis *]
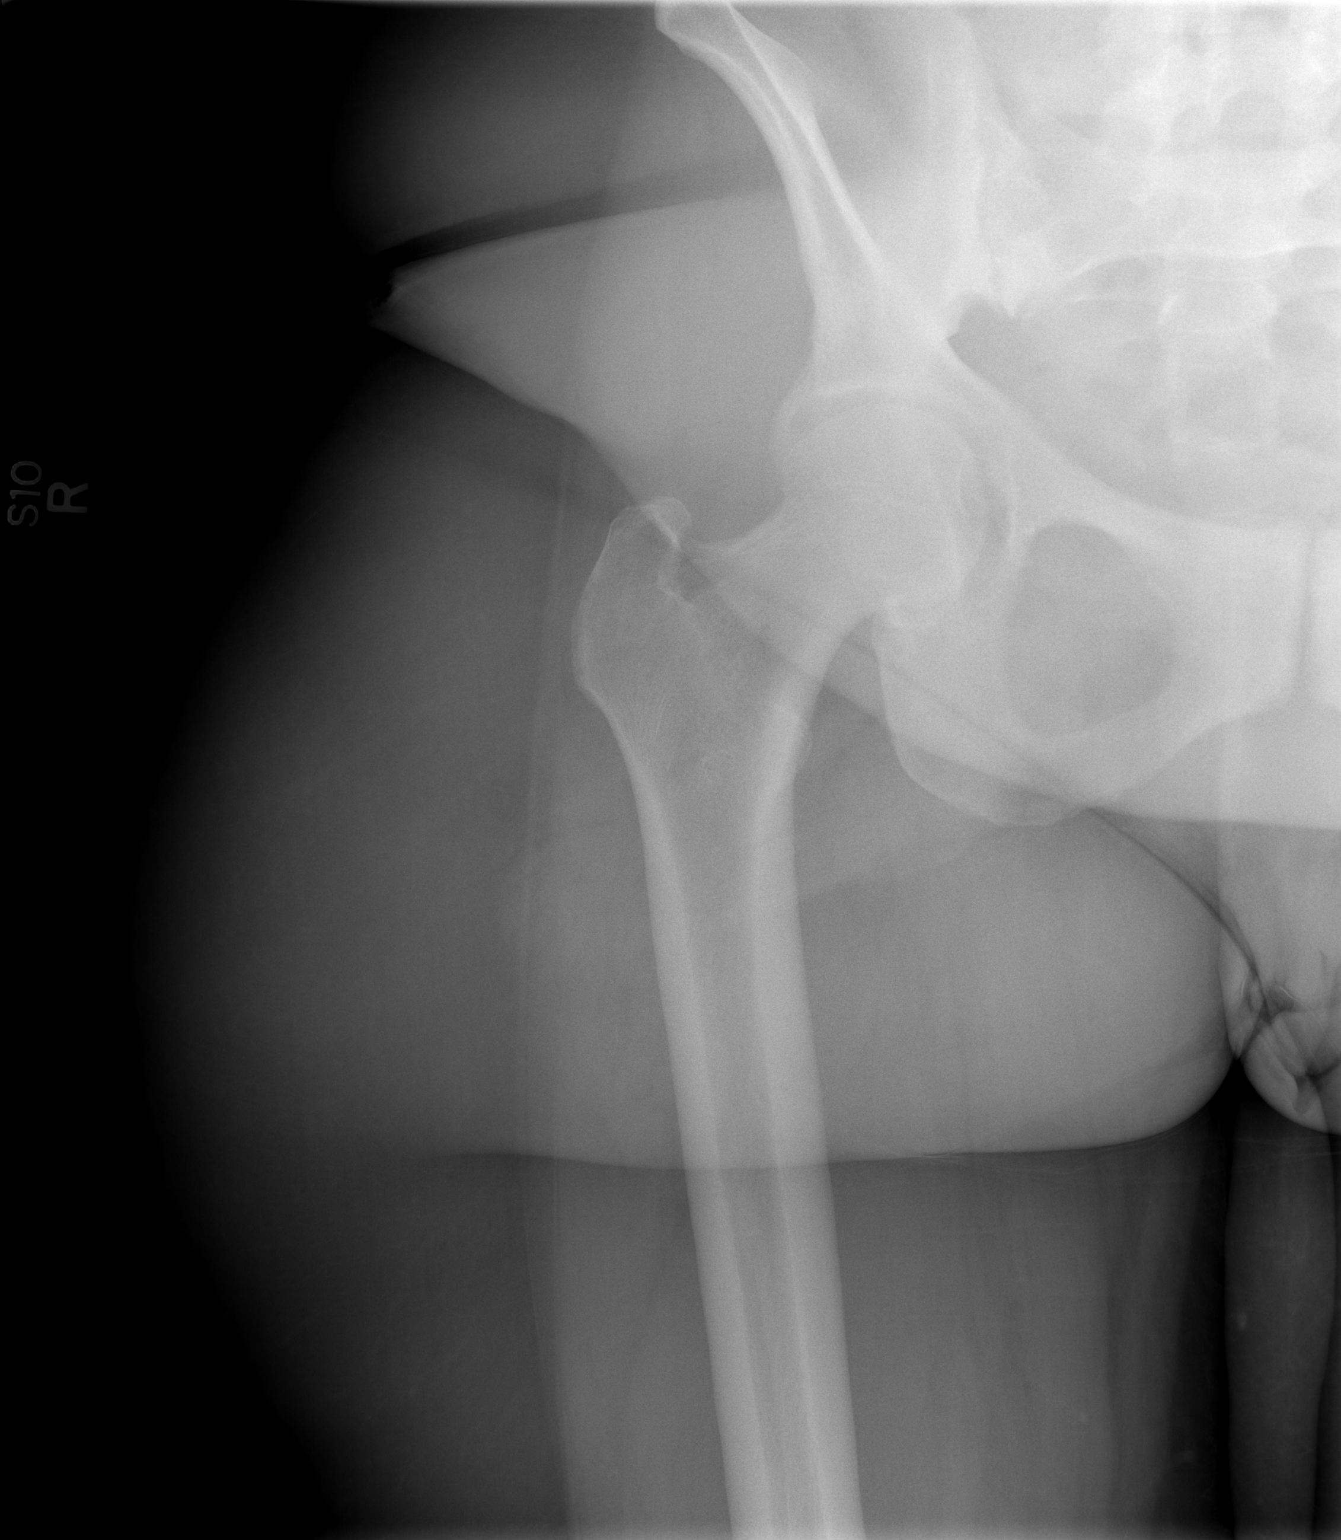

[t hip frog leg left]
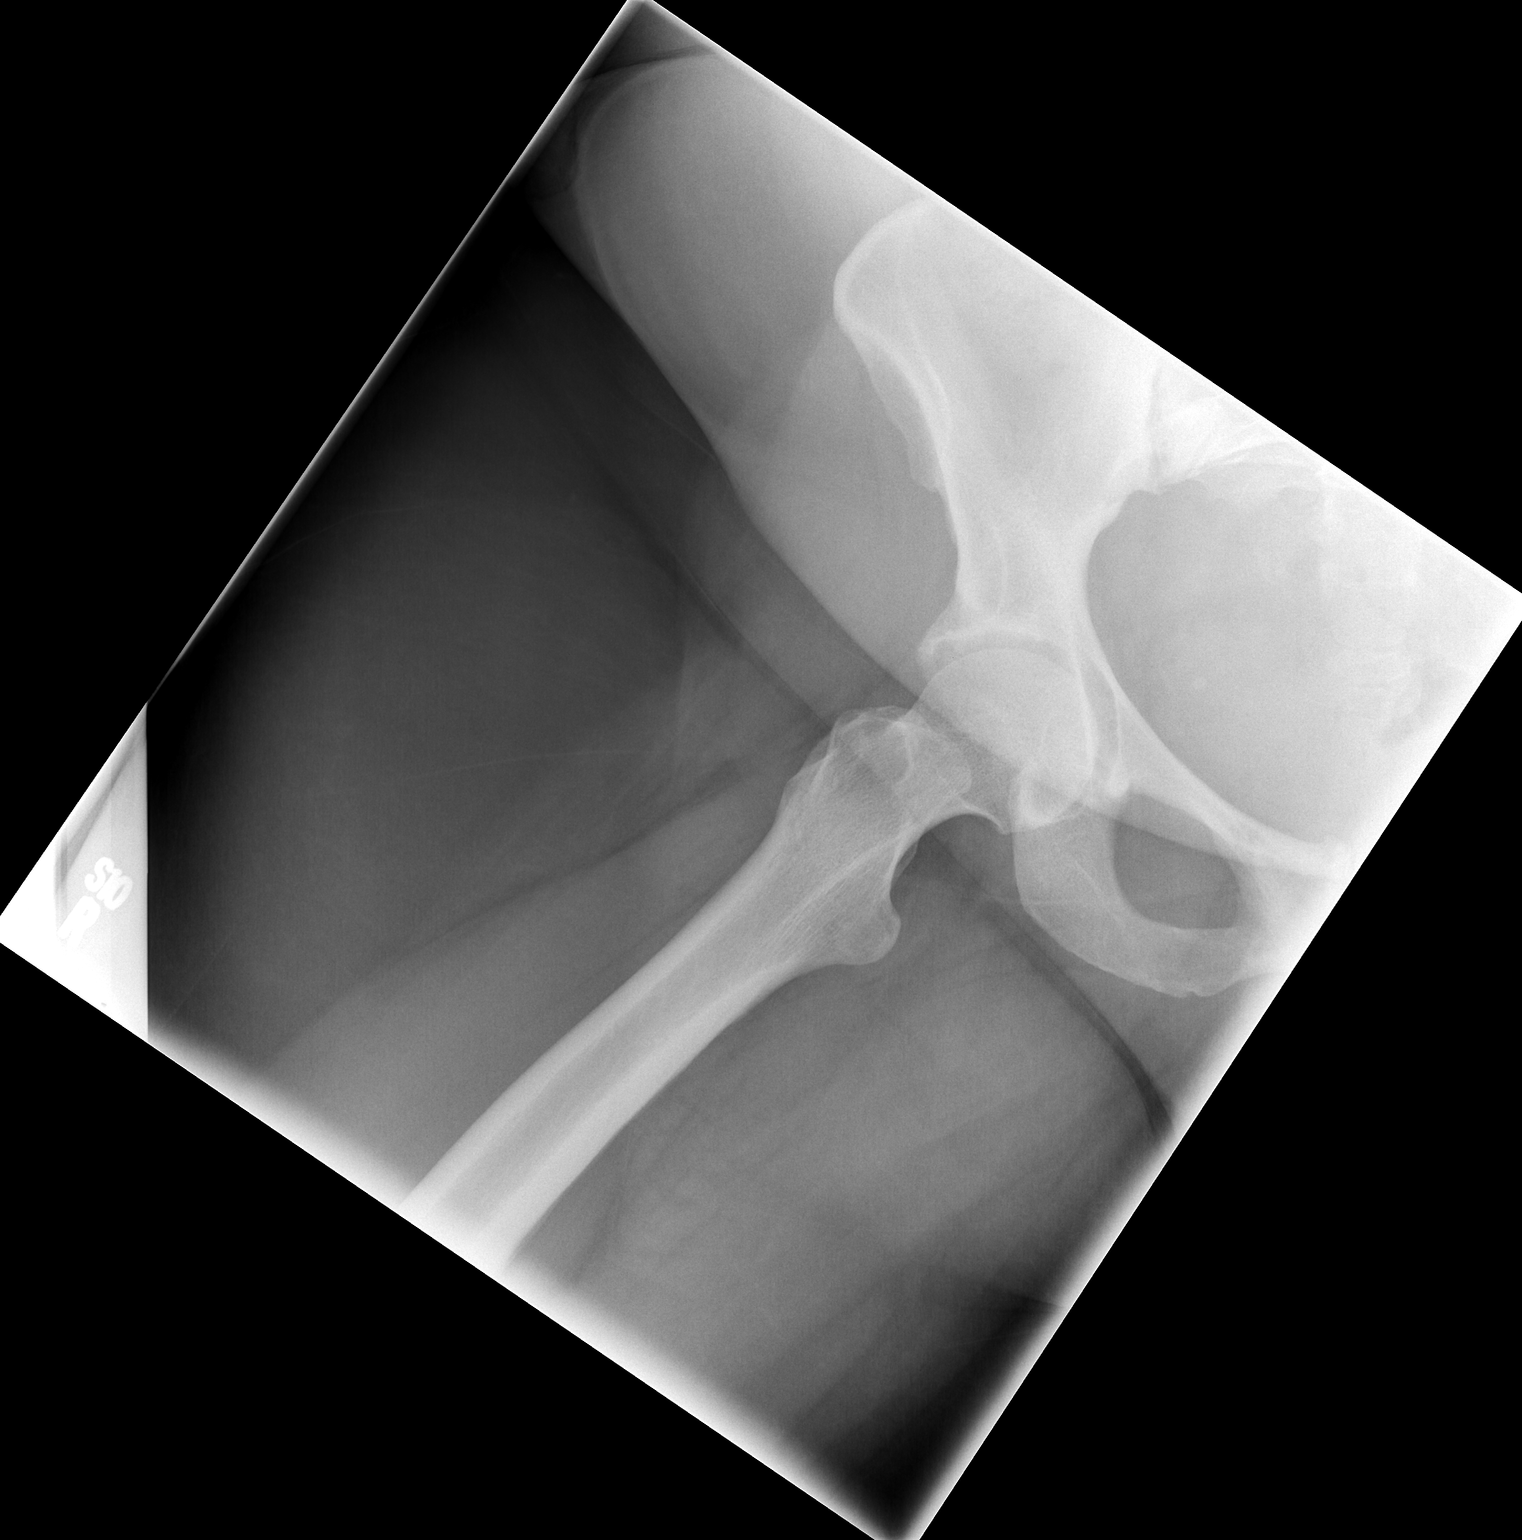

[2 of 2 positions shown; findings below may reference images not displayed]

FINDINGS: No acute bony abnormality is seen. No significant degenerative
change of the right hip joint is noted. Right SI joint is
unremarkable on the images obtained. The right pelvic ramus appears
intact.
IMPRESSION: Negative.

## 2017-01-16 IMAGING — CR DG SI JOINTS 3+V
3 series · 3 of 3 positions shown · non-contrast
Comparison: None.

CLINICAL DATA: Chronic low back and right hip pain without recent
injury.

EXAM:
BILATERAL SACROILIAC JOINTS - 3+ VIEW

[t sacroiliac joints (1 of 3)]
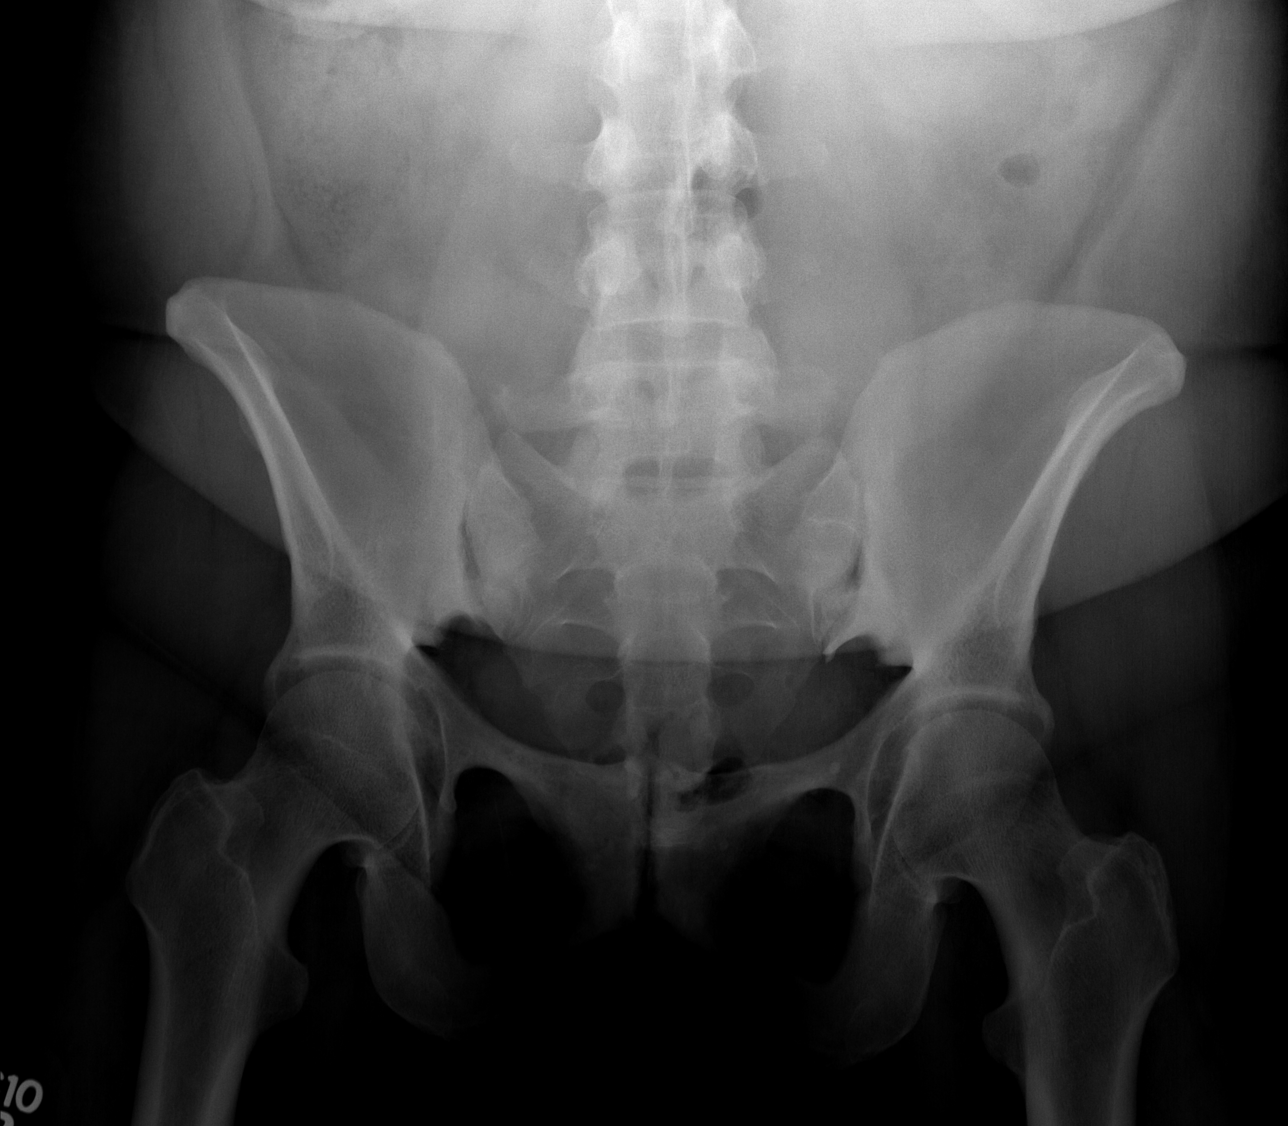

[t sacroiliac joints (2 of 3)]
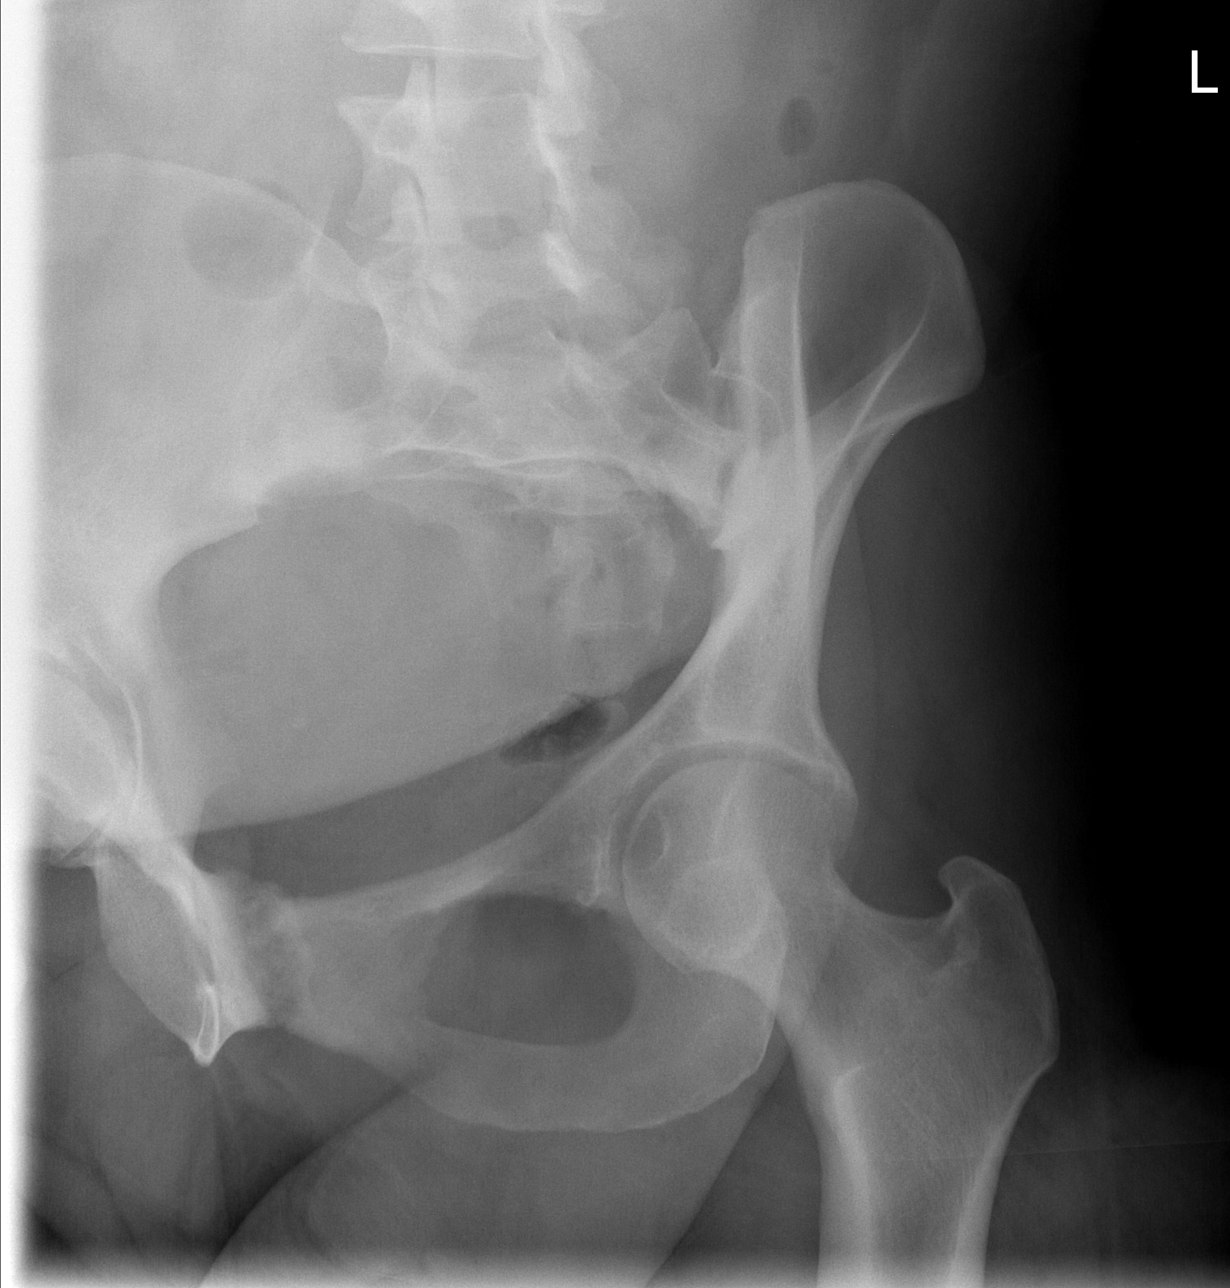

[t sacroiliac joints (3 of 3)]
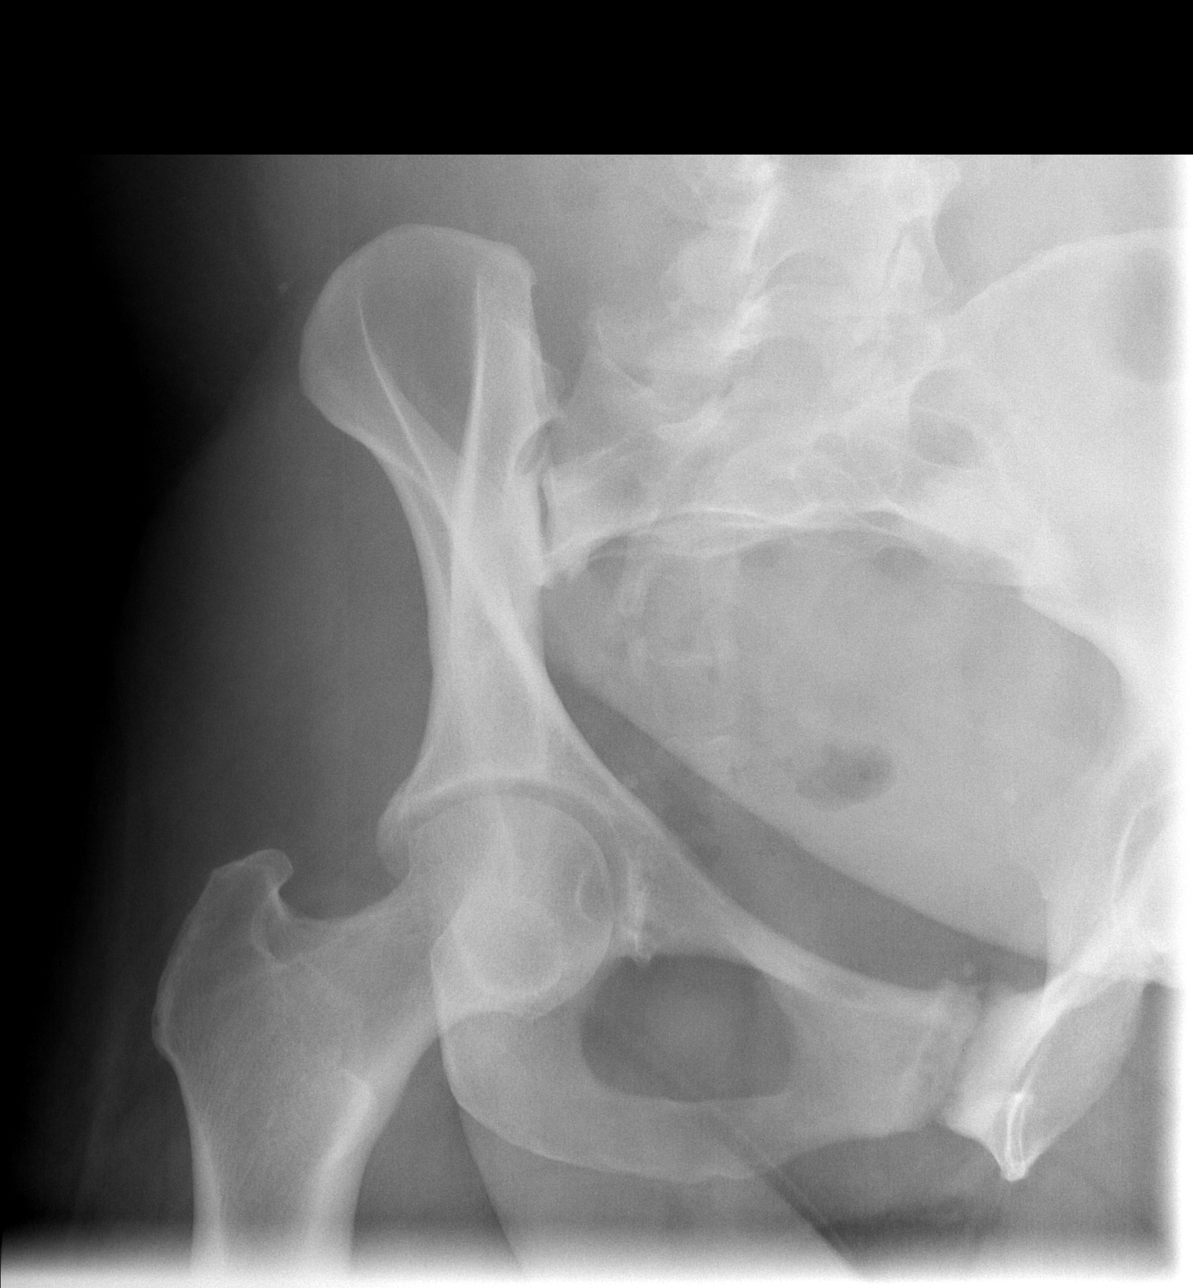

[3 of 3 positions shown; findings below may reference images not displayed]

FINDINGS: The sacroiliac joint spaces are maintained and there is no evidence
of arthropathy. No other bone abnormalities are seen.
IMPRESSION: Normal bilateral sacroiliac joints.

## 2017-12-14 ENCOUNTER — Ambulatory Visit: Payer: BLUE CROSS/BLUE SHIELD | Admitting: Family Medicine

## 2020-01-08 ENCOUNTER — Ambulatory Visit: Payer: Self-pay

## 2020-12-22 ENCOUNTER — Encounter (HOSPITAL_COMMUNITY): Payer: Self-pay

## 2020-12-22 ENCOUNTER — Other Ambulatory Visit: Payer: Self-pay

## 2020-12-22 ENCOUNTER — Ambulatory Visit (HOSPITAL_COMMUNITY)
Admission: EM | Admit: 2020-12-22 | Discharge: 2020-12-22 | Disposition: A | Discharge status: Home / Self Care | Payer: PRIVATE HEALTH INSURANCE | Attending: Internal Medicine | Admitting: Internal Medicine

## 2020-12-22 DIAGNOSIS — M7712 Lateral epicondylitis, left elbow: Secondary | ICD-10-CM

## 2020-12-22 DIAGNOSIS — J40 Bronchitis, not specified as acute or chronic: Secondary | ICD-10-CM

## 2020-12-22 DIAGNOSIS — J45901 Unspecified asthma with (acute) exacerbation: Secondary | ICD-10-CM

## 2020-12-22 MED ORDER — PREDNISONE 10 MG (21) PO TBPK: ORAL_TABLET | Freq: Every day | ORAL | 0 refills | Status: AC

## 2020-12-22 MED ORDER — DOXYCYCLINE HYCLATE 100 MG PO CAPS: 100.0000 mg | ORAL_CAPSULE | Freq: Two times a day (BID) | ORAL | 0 refills | Status: AC

## 2020-12-22 NOTE — ED Triage Notes (Signed)
Pt presents with non productive cough, congestion, and shortness of breath X 3 days.

## 2020-12-22 NOTE — ED Provider Notes (Signed)
[facility]    CSN: [id number] Arrival date & time: 12/22/20  0930      History   Chief Complaint Chief Complaint  Patient presents with  . Cough  . Congestion  . Shortness of Breath    HPI Shelly Villanueva is a 50 y.o. female who presents with a couple of complaints 1- cough with occasional production x 3 days. Started with sneezing and rhinitis. Has been wheezing more and worse last night since she has had to sleep sitting up. Has been using her inhaler more frequently. She admits she is a smoker   2- she hit her elbow tip 1 month ago and though is better she still feels pain on forearm muscle with supination. She does a lot of repetitive arm motion at work.      Past Medical History:  Diagnosis Date  . Asthma   . Chronic back pain     There are no problems to display for this patient.   History reviewed. No pertinent surgical history.  OB History   No obstetric history on file.      Home Medications    Prior to Admission medications   Medication Sig Start Date End Date Taking? Authorizing Provider  doxycycline (VIBRAMYCIN) 100 MG capsule Take 1 capsule (100 mg total) by mouth 2 (two) times daily. 12/22/20  Yes Rodriguez-Southworth, Nettie Elm, PA-C  predniSONE (STERAPRED UNI-PAK 21 TAB) 10 MG (21) TBPK tablet Take by mouth daily. Take 6 tabs by mouth daily  for 2 days, then 5 tabs for 2 days, then 4 tabs for 2 days, then 3 tabs for 2 days, 2 tabs for 2 days, then 1 tab by mouth daily for 2 days 12/22/20  Yes Rodriguez-Southworth, Nettie Elm, PA-C  albuterol (PROVENTIL HFA;VENTOLIN HFA) 108 (90 BASE) MCG/ACT inhaler Inhale 2 puffs into the lungs every 6 (six) hours as needed for wheezing or shortness of breath.    [provider]  clindamycin (CLEOCIN) 150 MG capsule Take 2 capsules (300 mg total) by mouth 3 (three) times daily. 09/30/15   Arthor Captain, PA-C  cyclobenzaprine (FLEXERIL) 10 MG tablet Take 1 tablet (10 mg total) by mouth 2 (two) times  daily as needed for muscle spasms. 09/30/15   Harris, Abigail, PA-C  diazepam (VALIUM) 5 MG tablet Take 1-2 tablets (5-10 mg total) by mouth every 8 (eight) hours as needed for muscle spasms. 09/20/14   Gwyneth Sprout, MD  diclofenac (VOLTAREN) 50 MG EC tablet Take 1 tablet (50 mg total) by mouth 3 (three) times daily. 09/24/14   Oswaldo Conroy, PA-C  oxyCODONE-acetaminophen (PERCOCET) 5-325 MG tablet Take 1-2 tablets by mouth every 4 (four) hours as needed. 09/30/15   Arthor Captain, PA-C    Family History Family History  Family history unknown: Yes    Social History Social History   Tobacco Use  . Smoking status: Current Every Day Smoker    Packs/day: 0.50    Types: Cigarettes  Substance Use Topics  . Alcohol use: No  . Drug use: No     Allergies   Penicillins   Review of Systems Review of Systems  Constitutional: Negative for activity change, appetite change, chills, diaphoresis, fatigue and fever.  HENT: Positive for postnasal drip, rhinorrhea and sore throat. Negative for congestion, ear discharge and ear pain.   Eyes: Negative for discharge.  Respiratory: Positive for cough, shortness of breath and wheezing. Negative for chest tightness.   Cardiovascular: Negative for chest pain.  Gastrointestinal: Negative for nausea and  vomiting.  Musculoskeletal: Positive for myalgias. Negative for gait problem.  Skin: Negative for rash.  Allergic/Immunologic: Positive for environmental allergies.  Neurological: Positive for headaches.  Hematological: Negative for adenopathy.     Physical Exam Triage Vital Signs ED Triage Vitals  Enc Vitals Group     BP 12/22/20 1006 117/61     Pulse Rate 12/22/20 1006 76     Resp 12/22/20 1006 16     Temp 12/22/20 1006 98.5 F (36.9 C)     Temp Source 12/22/20 1006 Oral     SpO2 12/22/20 1006 100 %     Weight --      Height --      Head Circumference --      Peak Flow --      Pain Score 12/22/20 1004 4     Pain Loc --      Pain  Edu? --      Excl. in GC? --    No data found.  Updated Vital Signs BP 117/61 (BP Location: Right Arm)   Pulse 76   Temp 98.5 F (36.9 C) (Oral)   Resp 16   SpO2 100%   Visual Acuity Right Eye Distance:   Left Eye Distance:   Bilateral Distance:    Right Eye Near:   Left Eye Near:    Bilateral Near:     Physical Exam Physical Exam Constitutional:      General: He is not in acute distress.    Appearance: He is not toxic-appearing.  HENT:     Head: Normocephalic.     Right Ear: Tympanic membrane, ear canal and external ear normal.     Left Ear: Ear canal and external ear normal.     Nose: Nose normal.     Mouth/Throat:     Mouth: Mucous membranes are moist.     Pharynx: Oropharynx is clear.  Eyes:     General: No scleral icterus.    Conjunctiva/sclera: Conjunctivae normal.  Cardiovascular:     Rate and Rhythm: Normal rate and regular rhythm.     Heart sounds: No murmur heard.   Pulmonary:     Effort: Pulmonary effort is normal. No respiratory distress.     Breath sounds: Wheezing present.     Comments: Has auditory wheezing Musculoskeletal:        General: Normal range of motion.     Cervical back: Neck supple.       L ELBOW- with no deformity on the olecranon, swelling or wounds. Has normal ROM. Supination provokes medial proximal forearm pain which is tender with palpation as well as her lateral epicondyle area Lymphadenopathy:     Cervical: No cervical adenopathy.  Skin:    General: Skin is warm and dry.     Findings: No rash.  Neurological:     Mental Status: He is alert and oriented to person, place, and time.     Gait: Gait normal.  Psychiatric:        Mood and Affect: Mood normal.        Behavior: Behavior normal.        Thought Content: Thought content normal.        Judgment: Judgment normal.     UC Treatments / Results  Labs (all labs ordered are listed, but only abnormal results are displayed) Labs Reviewed - No data to  display  EKG   Radiology No results found.  Procedures Procedures (including critical care time)  Medications Ordered in  UC Medications - No data to display  Initial Impression / Assessment and Plan / UC Course  I have reviewed the triage vital signs and the nursing notes. Has asthma exacerbation from URI and being smoker. She also has L tennis elbow and she was given a brace to wear.  I placed her on Doxy and prednisone as noted. See instructions.    Final Clinical Impressions(s) / UC Diagnoses   Final diagnoses:  Moderate asthma with exacerbation, unspecified whether persistent  Bronchitis  Lateral epicondylitis of left elbow     Discharge Instructions     Continue using your inhaler every 4-6 hours for wheezing and the prednisone should help even more.  Wear the tennis elbow brace when doing repetitive things with your left arm     ED Prescriptions    Medication Sig Dispense Auth. Provider   doxycycline (VIBRAMYCIN) 100 MG capsule Take 1 capsule (100 mg total) by mouth 2 (two) times daily. 20 capsule Rodriguez-Southworth, Nettie Elm, PA-C   predniSONE (STERAPRED UNI-PAK 21 TAB) 10 MG (21) TBPK tablet Take by mouth daily. Take 6 tabs by mouth daily  for 2 days, then 5 tabs for 2 days, then 4 tabs for 2 days, then 3 tabs for 2 days, 2 tabs for 2 days, then 1 tab by mouth daily for 2 days 42 tablet Rodriguez-Southworth, Nettie Elm, PA-C     PDMP not reviewed this encounter.   Garey Ham, PA-C 12/22/20 1045

## 2020-12-22 NOTE — Discharge Instructions (Addendum)
Continue using your inhaler every 4-6 hours for wheezing and the prednisone should help even more.  Wear the tennis elbow brace when doing repetitive things with your left arm
